# Patient Record
Sex: Male | Born: 1940
Health system: Southern US, Community
[De-identification: ages and names within clinical notes are randomized; demographics above are authoritative.]

## PROBLEM LIST (undated history)

## (undated) DIAGNOSIS — M1990 Unspecified osteoarthritis, unspecified site: Secondary | ICD-10-CM

## (undated) DIAGNOSIS — J302 Other seasonal allergic rhinitis: Secondary | ICD-10-CM

## (undated) DIAGNOSIS — I4510 Unspecified right bundle-branch block: Secondary | ICD-10-CM

## (undated) DIAGNOSIS — E039 Hypothyroidism, unspecified: Secondary | ICD-10-CM

## (undated) DIAGNOSIS — Z9289 Personal history of other medical treatment: Secondary | ICD-10-CM

## (undated) DIAGNOSIS — K219 Gastro-esophageal reflux disease without esophagitis: Secondary | ICD-10-CM

## (undated) DIAGNOSIS — N183 Chronic kidney disease, stage 3 unspecified: Secondary | ICD-10-CM

## (undated) DIAGNOSIS — R7302 Impaired glucose tolerance (oral): Secondary | ICD-10-CM

## (undated) DIAGNOSIS — D179 Benign lipomatous neoplasm, unspecified: Secondary | ICD-10-CM

## (undated) DIAGNOSIS — E669 Obesity, unspecified: Secondary | ICD-10-CM

## (undated) DIAGNOSIS — I809 Phlebitis and thrombophlebitis of unspecified site: Secondary | ICD-10-CM

## (undated) DIAGNOSIS — R1310 Dysphagia, unspecified: Secondary | ICD-10-CM

## (undated) DIAGNOSIS — J339 Nasal polyp, unspecified: Secondary | ICD-10-CM

## (undated) DIAGNOSIS — N529 Male erectile dysfunction, unspecified: Secondary | ICD-10-CM

## (undated) HISTORY — DX: Chronic kidney disease, stage 3 (moderate): N18.3

## (undated) HISTORY — DX: Impaired glucose tolerance (oral): R73.02

## (undated) HISTORY — DX: Male erectile dysfunction, unspecified: N52.9

## (undated) HISTORY — DX: Gastro-esophageal reflux disease without esophagitis: K21.9

## (undated) HISTORY — DX: Hypothyroidism, unspecified: E03.9

## (undated) HISTORY — PX: TONSILLECTOMY: SUR1361

## (undated) HISTORY — DX: Personal history of other medical treatment: Z92.89

## (undated) HISTORY — DX: Unspecified right bundle-branch block: I45.10

## (undated) HISTORY — DX: Other seasonal allergic rhinitis: J30.2

## (undated) HISTORY — DX: Obesity, unspecified: E66.9

## (undated) HISTORY — DX: Unspecified osteoarthritis, unspecified site: M19.90

## (undated) HISTORY — DX: Nasal polyp, unspecified: J33.9

## (undated) HISTORY — PX: LYMPH NODE BIOPSY: SHX201

## (undated) HISTORY — DX: Dysphagia, unspecified: R13.10

## (undated) HISTORY — DX: Benign lipomatous neoplasm, unspecified: D17.9

## (undated) HISTORY — DX: Chronic kidney disease, stage 3 unspecified: N18.30

---

## 2000-07-20 ENCOUNTER — Emergency Department (HOSPITAL_COMMUNITY): Admission: EM | Admit: 2000-07-20 | Discharge: 2000-07-20 | Payer: Self-pay | Admitting: Emergency Medicine

## 2013-02-24 DIAGNOSIS — Z125 Encounter for screening for malignant neoplasm of prostate: Secondary | ICD-10-CM | POA: Diagnosis not present

## 2013-02-24 DIAGNOSIS — K219 Gastro-esophageal reflux disease without esophagitis: Secondary | ICD-10-CM | POA: Diagnosis not present

## 2013-02-24 DIAGNOSIS — Z79899 Other long term (current) drug therapy: Secondary | ICD-10-CM | POA: Diagnosis not present

## 2013-02-24 DIAGNOSIS — N644 Mastodynia: Secondary | ICD-10-CM | POA: Diagnosis not present

## 2013-04-07 DIAGNOSIS — H60399 Other infective otitis externa, unspecified ear: Secondary | ICD-10-CM | POA: Diagnosis not present

## 2013-04-07 DIAGNOSIS — Z0000 Encounter for general adult medical examination without abnormal findings: Secondary | ICD-10-CM | POA: Diagnosis not present

## 2013-04-07 DIAGNOSIS — R0609 Other forms of dyspnea: Secondary | ICD-10-CM | POA: Diagnosis not present

## 2013-04-07 DIAGNOSIS — N529 Male erectile dysfunction, unspecified: Secondary | ICD-10-CM | POA: Diagnosis not present

## 2013-04-07 DIAGNOSIS — Z79899 Other long term (current) drug therapy: Secondary | ICD-10-CM | POA: Diagnosis not present

## 2013-04-07 DIAGNOSIS — J309 Allergic rhinitis, unspecified: Secondary | ICD-10-CM | POA: Diagnosis not present

## 2013-04-07 DIAGNOSIS — L820 Inflamed seborrheic keratosis: Secondary | ICD-10-CM | POA: Diagnosis not present

## 2013-04-07 DIAGNOSIS — R9431 Abnormal electrocardiogram [ECG] [EKG]: Secondary | ICD-10-CM | POA: Diagnosis not present

## 2013-04-07 DIAGNOSIS — K219 Gastro-esophageal reflux disease without esophagitis: Secondary | ICD-10-CM | POA: Diagnosis not present

## 2013-04-11 DIAGNOSIS — Z125 Encounter for screening for malignant neoplasm of prostate: Secondary | ICD-10-CM | POA: Diagnosis not present

## 2013-04-11 DIAGNOSIS — Z0000 Encounter for general adult medical examination without abnormal findings: Secondary | ICD-10-CM | POA: Diagnosis not present

## 2013-04-11 DIAGNOSIS — Z79899 Other long term (current) drug therapy: Secondary | ICD-10-CM | POA: Diagnosis not present

## 2013-04-11 DIAGNOSIS — N529 Male erectile dysfunction, unspecified: Secondary | ICD-10-CM | POA: Diagnosis not present

## 2013-05-09 DIAGNOSIS — E669 Obesity, unspecified: Secondary | ICD-10-CM | POA: Diagnosis not present

## 2013-05-09 DIAGNOSIS — R9431 Abnormal electrocardiogram [ECG] [EKG]: Secondary | ICD-10-CM | POA: Diagnosis not present

## 2013-05-09 DIAGNOSIS — R0602 Shortness of breath: Secondary | ICD-10-CM | POA: Diagnosis not present

## 2013-05-20 DIAGNOSIS — R9431 Abnormal electrocardiogram [ECG] [EKG]: Secondary | ICD-10-CM | POA: Diagnosis not present

## 2013-05-20 DIAGNOSIS — E669 Obesity, unspecified: Secondary | ICD-10-CM | POA: Diagnosis not present

## 2013-05-20 DIAGNOSIS — R0602 Shortness of breath: Secondary | ICD-10-CM | POA: Diagnosis not present

## 2013-07-12 DIAGNOSIS — N183 Chronic kidney disease, stage 3 unspecified: Secondary | ICD-10-CM | POA: Diagnosis not present

## 2013-07-12 DIAGNOSIS — R030 Elevated blood-pressure reading, without diagnosis of hypertension: Secondary | ICD-10-CM | POA: Diagnosis not present

## 2013-07-12 DIAGNOSIS — R9431 Abnormal electrocardiogram [ECG] [EKG]: Secondary | ICD-10-CM | POA: Diagnosis not present

## 2013-07-12 DIAGNOSIS — E669 Obesity, unspecified: Secondary | ICD-10-CM | POA: Diagnosis not present

## 2013-07-12 DIAGNOSIS — R0602 Shortness of breath: Secondary | ICD-10-CM | POA: Diagnosis not present

## 2013-07-12 DIAGNOSIS — I059 Rheumatic mitral valve disease, unspecified: Secondary | ICD-10-CM | POA: Diagnosis not present

## 2013-07-12 DIAGNOSIS — E559 Vitamin D deficiency, unspecified: Secondary | ICD-10-CM | POA: Diagnosis not present

## 2014-04-10 DIAGNOSIS — Z1331 Encounter for screening for depression: Secondary | ICD-10-CM | POA: Diagnosis not present

## 2014-04-10 DIAGNOSIS — Z136 Encounter for screening for cardiovascular disorders: Secondary | ICD-10-CM | POA: Diagnosis not present

## 2014-04-10 DIAGNOSIS — N183 Chronic kidney disease, stage 3 unspecified: Secondary | ICD-10-CM | POA: Diagnosis not present

## 2014-04-10 DIAGNOSIS — E559 Vitamin D deficiency, unspecified: Secondary | ICD-10-CM | POA: Diagnosis not present

## 2014-04-10 DIAGNOSIS — N529 Male erectile dysfunction, unspecified: Secondary | ICD-10-CM | POA: Diagnosis not present

## 2014-04-10 DIAGNOSIS — Z0000 Encounter for general adult medical examination without abnormal findings: Secondary | ICD-10-CM | POA: Diagnosis not present

## 2014-04-10 DIAGNOSIS — I059 Rheumatic mitral valve disease, unspecified: Secondary | ICD-10-CM | POA: Diagnosis not present

## 2014-04-10 DIAGNOSIS — R197 Diarrhea, unspecified: Secondary | ICD-10-CM | POA: Diagnosis not present

## 2014-04-18 DIAGNOSIS — E039 Hypothyroidism, unspecified: Secondary | ICD-10-CM | POA: Diagnosis not present

## 2014-04-18 DIAGNOSIS — Z125 Encounter for screening for malignant neoplasm of prostate: Secondary | ICD-10-CM | POA: Diagnosis not present

## 2014-04-18 DIAGNOSIS — N183 Chronic kidney disease, stage 3 unspecified: Secondary | ICD-10-CM | POA: Diagnosis not present

## 2014-04-26 ENCOUNTER — Encounter: Payer: Self-pay | Admitting: Interventional Cardiology

## 2014-05-15 DIAGNOSIS — E669 Obesity, unspecified: Secondary | ICD-10-CM | POA: Diagnosis not present

## 2014-05-15 DIAGNOSIS — Z6833 Body mass index (BMI) 33.0-33.9, adult: Secondary | ICD-10-CM | POA: Diagnosis not present

## 2014-05-15 DIAGNOSIS — R7309 Other abnormal glucose: Secondary | ICD-10-CM | POA: Diagnosis not present

## 2014-05-15 DIAGNOSIS — N183 Chronic kidney disease, stage 3 unspecified: Secondary | ICD-10-CM | POA: Diagnosis not present

## 2014-05-15 DIAGNOSIS — E039 Hypothyroidism, unspecified: Secondary | ICD-10-CM | POA: Diagnosis not present

## 2014-05-22 ENCOUNTER — Ambulatory Visit: Payer: Self-pay | Admitting: Interventional Cardiology

## 2014-09-05 ENCOUNTER — Encounter: Payer: Self-pay | Admitting: *Deleted

## 2015-05-29 DIAGNOSIS — Z1389 Encounter for screening for other disorder: Secondary | ICD-10-CM | POA: Diagnosis not present

## 2015-05-29 DIAGNOSIS — R195 Other fecal abnormalities: Secondary | ICD-10-CM | POA: Diagnosis not present

## 2015-05-29 DIAGNOSIS — I441 Atrioventricular block, second degree: Secondary | ICD-10-CM | POA: Diagnosis not present

## 2015-05-29 DIAGNOSIS — E559 Vitamin D deficiency, unspecified: Secondary | ICD-10-CM | POA: Diagnosis not present

## 2015-05-29 DIAGNOSIS — R030 Elevated blood-pressure reading, without diagnosis of hypertension: Secondary | ICD-10-CM | POA: Diagnosis not present

## 2015-05-29 DIAGNOSIS — I499 Cardiac arrhythmia, unspecified: Secondary | ICD-10-CM | POA: Diagnosis not present

## 2015-05-29 DIAGNOSIS — E669 Obesity, unspecified: Secondary | ICD-10-CM | POA: Diagnosis not present

## 2015-05-29 DIAGNOSIS — R739 Hyperglycemia, unspecified: Secondary | ICD-10-CM | POA: Diagnosis not present

## 2015-05-29 DIAGNOSIS — Z6832 Body mass index (BMI) 32.0-32.9, adult: Secondary | ICD-10-CM | POA: Diagnosis not present

## 2015-05-29 DIAGNOSIS — Z125 Encounter for screening for malignant neoplasm of prostate: Secondary | ICD-10-CM | POA: Diagnosis not present

## 2015-05-29 DIAGNOSIS — J209 Acute bronchitis, unspecified: Secondary | ICD-10-CM | POA: Diagnosis not present

## 2015-05-29 DIAGNOSIS — Z0001 Encounter for general adult medical examination with abnormal findings: Secondary | ICD-10-CM | POA: Diagnosis not present

## 2015-05-29 DIAGNOSIS — E780 Pure hypercholesterolemia: Secondary | ICD-10-CM | POA: Diagnosis not present

## 2015-06-05 ENCOUNTER — Telehealth: Payer: Self-pay | Admitting: Cardiology

## 2015-06-05 NOTE — Telephone Encounter (Signed)
Received records from [place] for appointment with [name], [place] on 06/14/15.  Records given to [employer] (medical records) for Smitty Cords' schedule on 06/14/15.

## 2015-06-12 ENCOUNTER — Telehealth: Payer: Self-pay | Admitting: Cardiology

## 2015-06-12 NOTE — Telephone Encounter (Signed)
Received records from [facility] for appointment on 06/14/15 with [name], [place].  Records given to [employer] (medical records) for Terrence Beard's schedule on 06/14/15. lp

## 2015-06-14 ENCOUNTER — Encounter: Payer: Self-pay | Admitting: Cardiology

## 2015-06-14 NOTE — Progress Notes (Signed)
Encounter opened in error.   Terrence Beard  

## 2015-07-04 DIAGNOSIS — R739 Hyperglycemia, unspecified: Secondary | ICD-10-CM | POA: Diagnosis not present

## 2015-07-04 DIAGNOSIS — R195 Other fecal abnormalities: Secondary | ICD-10-CM | POA: Diagnosis not present

## 2015-07-04 DIAGNOSIS — B80 Enterobiasis: Secondary | ICD-10-CM | POA: Diagnosis not present

## 2015-07-04 DIAGNOSIS — E559 Vitamin D deficiency, unspecified: Secondary | ICD-10-CM | POA: Diagnosis not present

## 2015-07-04 DIAGNOSIS — N529 Male erectile dysfunction, unspecified: Secondary | ICD-10-CM | POA: Diagnosis not present

## 2015-07-04 DIAGNOSIS — N183 Chronic kidney disease, stage 3 (moderate): Secondary | ICD-10-CM | POA: Diagnosis not present

## 2015-07-04 DIAGNOSIS — I441 Atrioventricular block, second degree: Secondary | ICD-10-CM | POA: Diagnosis not present

## 2015-07-04 DIAGNOSIS — R030 Elevated blood-pressure reading, without diagnosis of hypertension: Secondary | ICD-10-CM | POA: Diagnosis not present

## 2015-07-04 DIAGNOSIS — J209 Acute bronchitis, unspecified: Secondary | ICD-10-CM | POA: Diagnosis not present

## 2015-07-04 DIAGNOSIS — J309 Allergic rhinitis, unspecified: Secondary | ICD-10-CM | POA: Diagnosis not present

## 2015-07-04 DIAGNOSIS — I340 Nonrheumatic mitral (valve) insufficiency: Secondary | ICD-10-CM | POA: Diagnosis not present

## 2015-07-04 DIAGNOSIS — K219 Gastro-esophageal reflux disease without esophagitis: Secondary | ICD-10-CM | POA: Diagnosis not present

## 2015-07-19 ENCOUNTER — Other Ambulatory Visit: Payer: Self-pay | Admitting: Gastroenterology

## 2015-08-07 ENCOUNTER — Other Ambulatory Visit: Payer: Self-pay

## 2015-08-07 NOTE — Progress Notes (Signed)
Cardiology Office Note   Date:  08/08/2015   ID:  Jc Veron, DOB August 11, 1941, MRN [id number]  PCP:  Henrine Screws, MD  Cardiologist:  new  Electrophysiologist:  n/a  Chief Complaint  Patient presents with  . Abnormal ECG     History of Present Illness: Terrence Beard is a 74 y.o. male with a hx of RBBB, hypothyroidism, CKD, GERD.    Referred by his PCP for evaluation of abnormal EKG. He's had known right bundle branch block. ECG recently demonstrated second-degree block. Patient denies syncope or near-syncope. He denies significant dyspnea. He denies chest discomfort. He denies orthopnea, PND or significant edema. Patient tells me he had a Nuclear Stress Test 2 years ago. We do not have any records.   Studies/Reports Reviewed Today:  None    Past Medical History  Diagnosis Date  . RBBB   . Seasonal allergies   . Obesity   . Multiple lipomas   . Dysphagia   . Nasal polyposis   . Hypothyroidism   . Glucose intolerance (impaired glucose tolerance)   . CKD (chronic kidney disease) stage 3, GFR 30-59 ml/min   . Erectile dysfunction   . DJD (degenerative joint disease)   . GERD (gastroesophageal reflux disease)     Past Surgical History  Procedure Laterality Date  . Lymph node biopsy    . Tonsillectomy       Current Outpatient Prescriptions  Medication Sig Dispense Refill  . Cholecalciferol (VITAMIN D) 2000 UNITS tablet Take 2,000 Units by mouth daily.    Marland Kitchen levothyroxine (SYNTHROID, LEVOTHROID) 50 MCG tablet Take 50 mcg by mouth daily before breakfast.    . omeprazole (PRILOSEC) 20 MG capsule Take 20 mg by mouth daily.    . sildenafil (VIAGRA) 100 MG tablet Take 100 mg by mouth daily as needed for erectile dysfunction.     No current facility-administered medications for this visit.    Allergies:   Review of patient's allergies indicates no known allergies.    Social History:  Social History   Social History  . Marital Status: Divorced      Spouse Name: N/A  . Number of Children: N/A  . Years of Education: N/A   Occupational History  . Employee Benefit Consultant     [employer]   Social History Main Topics  . Smoking status: Never Smoker   . Smokeless tobacco: None  . Alcohol Use: 0.6 oz/week    1 Standard drinks or equivalent per week  . Drug Use: No  . Sexual Activity: Not Asked   Other Topics Concern  . None   Social History Narrative   Hobby:  Vineyard       Family History:  The patient's family history includes Diabetes in his maternal grandmother; GER disease in his father; Kidney disease in his mother. There is no history of Heart attack.    ROS:   Please see the history of present illness.   Review of Systems  Cardiovascular: Positive for irregular heartbeat.  Respiratory: Positive for snoring.   All other systems reviewed and are negative.     PHYSICAL EXAM: VS:  BP 140/90 mmHg  Pulse 58  Ht 5\' 11"  (1.803 m)  Wt 234 lb 12.8 oz (106.505 kg)  BMI 32.76 kg/m2    Wt Readings from Last 3 Encounters:  08/08/15 234 lb 12.8 oz (106.505 kg)     GEN: Well nourished, well developed, in no acute distress HEENT: normal Neck: no JVD,  no carotid bruits, no masses Cardiac:  Normal S1/S2, RRR; no murmur ,  no rubs or gallops, trace-1+ bilateral edema   Respiratory:  clear to auscultation bilaterally, no wheezing, rhonchi or rales. GI: soft, nontender, nondistended, + BS MS: no deformity or atrophy Skin: warm and dry  Neuro:  CNs II-XII intact, Strength and sensation are intact Psych: Normal affect   EKG:  EKG is ordered today.  It demonstrates:   Sinus brady, HR 58, RBBB, 1st degree AVB (460 ms), Wenckebach, LAD. ECG from PCP 7/16:  NSR, HR 64, 1st degree AVB, Wenckebach, RBBB    Recent Labs: No results found for requested labs within last 365 days.  Labs 7/16 (from PCP):  Hgb A1c 6.4, K 4.6, SCr 1.5, Hgb 14.2, Plt 263, ALT 22, TSH 2.89, TC 164, LDL 101, HDL 37  Lipid  Panel No results found for: CHOL, TRIG, HDL, CHOLHDL, VLDL, LDLCALC, LDLDIRECT    ASSESSMENT AND PLAN:  1. Trifascicular Block:  He has evidence of conduction system disease with a very long 1st degree AVB, 2nd degree Type 1 AVB, RBB and LAFB.  He appears to be asymptomatic. Reviewed with Dr. Thompson Grayer who also saw the patient.  Will get prior records from [place]. Recent K+ and TSH normal. He does not take any AV nodal blocking agents.  If no echo has been done in the last 2 years, will get a FU Echo as well. Arrange 48 Hr Holter to rule out high grade heart block.  Arrange FU with EP.     Medication Changes: Current medicines are reviewed at length with the patient today.  Concerns regarding medicines are as outlined above.  The following changes have been made:   Discontinued Medications   AZITHROMYCIN (ZITHROMAX) 250 MG TABLET    TAKE 2 TABLETS BY MOUTH TODAY, THEN TAKE 1 TABLET DAILY FOR 4 DAYS   NEOMYCIN-POLYMYXIN-HYDROCORTISONE (CORTISPORIN) 3.5-10000-1 OTIC SUSPENSION    INSTILL 4 DROPS INTO AFFECTED EAR(S) THREE TIMES PER DAY FOR 7 DAYS   POLYETHYLENE GLYCOL-ELECTROLYTES (NULYTELY/GOLYTELY) 420 G SOLUTION    See admin instructions.   Modified Medications   No medications on file   New Prescriptions   No medications on file   Labs/ tests ordered today include:   Orders Placed This Encounter  Procedures  . Holter monitor - 48 hour  . EKG 12-Lead     Disposition:    FU with Dr. Allegra Lai after Holter monitor is done.     Signed, Versie Starks, MHS 08/08/2015 1:14 PM    [facility] [address], [place], [place]  [address] Phone: [phone]; Fax: [phone]

## 2015-08-08 ENCOUNTER — Ambulatory Visit (INDEPENDENT_AMBULATORY_CARE_PROVIDER_SITE_OTHER): Payer: Medicare Other | Admitting: Physician Assistant

## 2015-08-08 ENCOUNTER — Encounter: Payer: Self-pay | Admitting: Physician Assistant

## 2015-08-08 VITALS — BP 140/90 | HR 58 | Ht 71.0 in | Wt 234.8 lb

## 2015-08-08 DIAGNOSIS — E039 Hypothyroidism, unspecified: Secondary | ICD-10-CM | POA: Insufficient documentation

## 2015-08-08 DIAGNOSIS — N189 Chronic kidney disease, unspecified: Secondary | ICD-10-CM | POA: Insufficient documentation

## 2015-08-08 DIAGNOSIS — I453 Trifascicular block: Secondary | ICD-10-CM

## 2015-08-08 DIAGNOSIS — R9431 Abnormal electrocardiogram [ECG] [EKG]: Secondary | ICD-10-CM

## 2015-08-08 DIAGNOSIS — I4510 Unspecified right bundle-branch block: Secondary | ICD-10-CM | POA: Insufficient documentation

## 2015-08-08 NOTE — Patient Instructions (Signed)
Medication Instructions:  Your physician recommends that you continue on your current medications as directed. Please refer to the Current Medication list given to you today.   Labwork: NONE  Testing/Procedures: Your physician has recommended that you wear a 48 HOUR holter monitor. Holter monitors are medical devices that record the heart's electrical activity. Doctors most often use these monitors to diagnose arrhythmias. Arrhythmias are problems with the speed or rhythm of the heartbeat. The monitor is a small, portable device. You can wear one while you do your normal daily activities. This is usually used to diagnose what is causing palpitations/syncope (passing out).   Follow-Up: 2-3 WEEKS WITH DR. Curt Bears DX TRIFASCICULAR BLOCK  Any Other Special Instructions Will Be Listed Below (If Applicable).

## 2015-08-13 ENCOUNTER — Other Ambulatory Visit: Payer: Self-pay | Admitting: Physician Assistant

## 2015-08-13 DIAGNOSIS — R9431 Abnormal electrocardiogram [ECG] [EKG]: Secondary | ICD-10-CM

## 2015-08-13 DIAGNOSIS — I440 Atrioventricular block, first degree: Secondary | ICD-10-CM

## 2015-08-13 DIAGNOSIS — I453 Trifascicular block: Secondary | ICD-10-CM

## 2015-08-13 DIAGNOSIS — I441 Atrioventricular block, second degree: Secondary | ICD-10-CM

## 2015-08-24 ENCOUNTER — Encounter: Payer: Self-pay | Admitting: Physician Assistant

## 2015-08-24 NOTE — Progress Notes (Signed)
Received records. PMH updated with prior stress test and echo. ETT-Myoview in 2014 with normal perfusion. Echo in 2014 with normal EF and mild to mod MR. Patient last saw Dr. Casandra Doffing in 2014 at [place]. Richardson Dopp, PA-C   08/24/2015 11:42 AM

## 2015-09-05 ENCOUNTER — Telehealth: Payer: Self-pay | Admitting: Physician Assistant

## 2015-09-05 NOTE — Telephone Encounter (Signed)
New Message  Pt was seen 10/2 w/ Nicki Reaper- was to have heart monitor and see Dr Curt Bears on 10/3. Pt was not able to do heart monitor, pt calling to see if he should postpone EP appt for after heart monitor, or come in anyway. Please call back and discuss.

## 2015-09-05 NOTE — Telephone Encounter (Signed)
S/w pt per Brynda Rim. PA recommendation is to cancel 11/3 appt w/Dr. Curt Bears, get 48 hour monitor placed this week then reschedule EP appt for 1 week later after monitor. Pt would like to have monitor placed Friday since he has to shower for work everyday.

## 2015-09-06 ENCOUNTER — Ambulatory Visit: Payer: Medicare Other | Admitting: Cardiology

## 2015-09-07 ENCOUNTER — Ambulatory Visit (INDEPENDENT_AMBULATORY_CARE_PROVIDER_SITE_OTHER): Payer: Medicare Other

## 2015-09-07 DIAGNOSIS — R9431 Abnormal electrocardiogram [ECG] [EKG]: Secondary | ICD-10-CM

## 2015-09-07 DIAGNOSIS — I441 Atrioventricular block, second degree: Secondary | ICD-10-CM | POA: Diagnosis not present

## 2015-09-07 DIAGNOSIS — I440 Atrioventricular block, first degree: Secondary | ICD-10-CM | POA: Diagnosis not present

## 2015-09-07 DIAGNOSIS — I453 Trifascicular block: Secondary | ICD-10-CM | POA: Diagnosis not present

## 2015-09-10 ENCOUNTER — Other Ambulatory Visit: Payer: Self-pay | Admitting: Gastroenterology

## 2015-09-11 ENCOUNTER — Encounter (HOSPITAL_COMMUNITY): Payer: Self-pay | Admitting: *Deleted

## 2015-09-18 ENCOUNTER — Encounter (HOSPITAL_COMMUNITY): Payer: Self-pay | Admitting: Anesthesiology

## 2015-09-18 ENCOUNTER — Ambulatory Visit (HOSPITAL_COMMUNITY): Admission: RE | Admit: 2015-09-18 | Payer: Medicare Other | Source: Ambulatory Visit | Admitting: Gastroenterology

## 2015-09-18 SURGERY — COLONOSCOPY WITH PROPOFOL
Anesthesia: Monitor Anesthesia Care

## 2015-09-19 ENCOUNTER — Ambulatory Visit (INDEPENDENT_AMBULATORY_CARE_PROVIDER_SITE_OTHER): Payer: Medicare Other | Admitting: Cardiology

## 2015-09-19 ENCOUNTER — Encounter: Payer: Self-pay | Admitting: Cardiology

## 2015-09-19 VITALS — BP 120/62 | HR 53 | Ht 71.0 in | Wt 237.6 lb

## 2015-09-19 DIAGNOSIS — I453 Trifascicular block: Secondary | ICD-10-CM

## 2015-09-19 DIAGNOSIS — Z01812 Encounter for preprocedural laboratory examination: Secondary | ICD-10-CM

## 2015-09-19 NOTE — Progress Notes (Signed)
Electrophysiology Office Note   Date:  09/19/2015   ID:  [name], Terrence Beard 08, 1942, MRN [id number]  PCP:  Henrine Screws, MD  Cardiologist:  Richardson Dopp Primary Electrophysiologist:  Damarie Schoolfield Meredith Leeds, MD    Chief Complaint  Patient presents with  . Follow-up    MONITOR     History of Present Illness: Terrence Beard is a 74 y.o. male who presents today for electrophysiology evaluation.   He has a history of long standing RBBB and was found to have trifascicular block on a recent ECG.  He has periods of mobitz I block as well as first degree AV block.  He is currently asymptomatic.  He has no dizziness but does endorse shortness of breath when walking up hills.  He is able to hunt and carry his guns and ammo through the woods without issues.  He had an event monitor showing [name] with a severely prolonged PR interval.   Today, he denies symptoms of palpitations, chest pain, shortness of breath, orthopnea, PND, lower extremity edema, claudication, dizziness, presyncope, syncope, bleeding, or neurologic sequela. The patient is tolerating medications without difficulties and is otherwise without complaint today.    Past Medical History  Diagnosis Date  . RBBB   . Seasonal allergies   . Obesity   . Multiple lipomas   . Dysphagia   . Nasal polyposis   . Hypothyroidism   . Glucose intolerance (impaired glucose tolerance)   . Erectile dysfunction   . DJD (degenerative joint disease)   . GERD (gastroesophageal reflux disease)   . History of nuclear stress test     a. ETT-Myoview 7/14:  EF 71%, normal perfusion, low risk  . History of echocardiogram     a. Echo 7/14:  EF 60-65%, mod asymmetric LVH, Mild LAE, mild to mod MR,   . CKD (chronic kidney disease) stage 3, GFR 30-59 ml/min     not treated   Past Surgical History  Procedure Laterality Date  . Lymph node biopsy    . Tonsillectomy       Current Outpatient Prescriptions  Medication Sig  Dispense Refill  . calcium carbonate (TUMS - DOSED IN MG ELEMENTAL CALCIUM) 500 MG chewable tablet Chew 2-3 tablets by mouth at bedtime as needed for indigestion or heartburn.    . Cholecalciferol (VITAMIN D) 2000 UNITS tablet Take 2,000 Units by mouth daily.    Marland Kitchen levothyroxine (SYNTHROID, LEVOTHROID) 50 MCG tablet Take 50 mcg by mouth daily before breakfast.    . omeprazole (PRILOSEC) 20 MG capsule Take 20 mg by mouth at bedtime as needed (heartburn).     . sildenafil (VIAGRA) 100 MG tablet Take 100 mg by mouth daily as needed for erectile dysfunction.     No current facility-administered medications for this visit.    Allergies:   Review of patient's allergies indicates no known allergies.   Social History:  The patient  reports that he has never smoked. He does not have any smokeless tobacco history on file. He reports that he drinks about 0.6 oz of alcohol per week. He reports that he does not use illicit drugs.   Family History:  The patient's family history includes Diabetes in his maternal grandmother; GER disease in his father; Kidney disease in his mother. There is no history of Heart attack.    ROS:  Please see the history of present illness.   All other systems are reviewed and negative.    PHYSICAL EXAM: VS:  BP 120/62  mmHg  Pulse 53  Ht 5\' 11"  (1.803 m)  Wt 237 lb 9.6 oz (107.775 kg)  BMI 33.15 kg/m2 , BMI Body mass index is 33.15 kg/(m^2). GEN: Well nourished, well developed, in no acute distress HEENT: normal Neck: no JVD, carotid bruits, or masses Cardiac: irregular rhythm; no murmurs, rubs, or gallops,no edema  Respiratory:  clear to auscultation bilaterally, normal work of breathing GI: soft, nontender, nondistended, + BS MS: no deformity or atrophy Skin: warm and dry Neuro:  Strength and sensation are intact Psych: euthymic mood, full affect  EKG:  EKG is ordered today. The ekg ordered today shows sinus rhythm, RBBB, LAFB, mobitz I block  Recent Labs: No  results found for requested labs within last 365 days.    Lipid Panel  No results found for: CHOL, TRIG, HDL, CHOLHDL, VLDL, LDLCALC, LDLDIRECT   Wt Readings from Last 3 Encounters:  09/19/15 237 lb 9.6 oz (107.775 kg)  08/08/15 234 lb 12.8 oz (106.505 kg)      Other studies Reviewed: Additional studies/ records that were reviewed today include: 48 hour monitor  Review of the above records today demonstrates:  Mean heart rate 67 bpm Maximum heart rate 109 bpm Minimum heart rate 48 bpm Ventricular beats 497 (0.32%) Supraventricular beats 542 (0.3%) Sinus rhythm with first degree AV block Intermittent Mobitz I block     TTE 09/07/15 EF 60-65%   ASSESSMENT AND PLAN:  1.  Trifascicular block: Unknown if supra vs infra-his block.  Patient is asymptomatic, but with significant conduction disease, unable to determine site of block.  Jalaina Salyers plan on EP study to determine site of block.  Should he have infra-his block, Saharsh Sterling plan to implant dual chamber pacemaker.  If his block is above the his, Johnita Palleschi plan to continue to monitor for worsening block.    Current medicines are reviewed at length with the patient today.   The patient does not have concerns regarding his medicines.  The following changes were made today:  none  Labs/ tests ordered today include:  Orders Placed This Encounter  Procedures  . Basic metabolic panel  . CBC w/Diff     Disposition:   FU with Kately Graffam post procedure  Signed, Kelse Ploch Meredith Leeds, MD  09/19/2015 8:45 PM     [address] [place] [address] [phone] (office) [phone] (fax)

## 2015-09-19 NOTE — Patient Instructions (Addendum)
Medication Instructions:  Your physician recommends that you continue on your current medications as directed. Please refer to the Current Medication list given to you today.  Labwork: Pre procedure labs on: 10/16/15  Testing/Procedures: Your physician has recommended that you have an EP Study. This test is used to assess serious arrhythmias (irregular heartbeats). During an Electro-physiology Study (EPS), a thin, flexible wire is passed through a vein in your groin (upper thigh) or neck up to the heart. The wire records the heart's electrical signals. Your doctor uses the wire to electrically stimulate your heart and trigger an arrhythmic. This allows the doctor to see whether an antiarrhythmia medicine can help manage the problem or if further procedures are necessary (i.e., ablation/ICD). Radiofrequency ablation, a procedure used to fix some types of arrthythmia, may be done during an EPS. This is done in the hospital and often requires an overnight stay.  Scheduled for 10/24/15. Kendrik Mcshan, RN will call you with details.  Follow-Up: To be determined once procedure is scheduled.   If you need a refill on your cardiac medications before your next appointment, please call your pharmacy.  Thank you for choosing [facility]!!   Trinidad Curet, RN [phone]

## 2015-09-25 ENCOUNTER — Encounter (HOSPITAL_COMMUNITY): Payer: Self-pay

## 2015-09-25 ENCOUNTER — Ambulatory Visit (HOSPITAL_COMMUNITY): Admit: 2015-09-25 | Payer: Self-pay | Admitting: Internal Medicine

## 2015-09-25 SURGERY — LOOP RECORDER INSERTION
Anesthesia: LOCAL

## 2015-10-02 ENCOUNTER — Encounter: Payer: Self-pay | Admitting: *Deleted

## 2015-10-02 ENCOUNTER — Telehealth: Payer: Self-pay | Admitting: *Deleted

## 2015-10-02 NOTE — Telephone Encounter (Signed)
Reviewed procedure instructions with patient. Patient made aware that PPM may be implanted secondary to EPS findings - and if implanted he will have to stay overnight in hospital. Letter of instructions reviewed with patient and mailed to home address. EPS & ? PPM implant scheduled for 12/21. Pre procedure labs 10/16/15. Wound check scheduled for 12/29.  (will follow procedure, if no PPM implanted - then I will cancel wound check). Patient verbalized understanding and agreeable to plan.

## 2015-10-09 NOTE — Addendum Note (Signed)
Addended by: Freada Bergeron on: 10/09/2015 05:09 PM   Modules accepted: Orders

## 2015-10-16 ENCOUNTER — Telehealth: Payer: Self-pay | Admitting: Cardiology

## 2015-10-16 ENCOUNTER — Other Ambulatory Visit: Payer: Medicare Other

## 2015-10-16 NOTE — Telephone Encounter (Signed)
New message      Pt is calling to postpone his 10-24-15 procedure and labs scheduled for today.  He will call to give you some good dates for it to be rescheduled after christmas.  You do not have to call unless this is not ok with the doctor.

## 2015-10-16 NOTE — Telephone Encounter (Signed)
Pt requesting to cancel procedure scheduled for 10/24/15. Pt advised I will forward to Sherri to follow up with pt tomorrow, I did not cancel procedure.

## 2015-10-17 NOTE — Telephone Encounter (Signed)
Returned patient's call.  He would like to reschedule but is currently driving and does not have his calendar on him. Patient will call me by end of week or beginning of next to reschedule.

## 2015-10-24 ENCOUNTER — Encounter (HOSPITAL_COMMUNITY): Admission: RE | Payer: Self-pay | Source: Ambulatory Visit

## 2015-10-24 ENCOUNTER — Ambulatory Visit (HOSPITAL_COMMUNITY): Admission: RE | Admit: 2015-10-24 | Payer: Medicare Other | Source: Ambulatory Visit

## 2015-10-24 SURGERY — ELECTROPHYSIOLOGY STUDY
Anesthesia: LOCAL

## 2015-11-01 ENCOUNTER — Ambulatory Visit: Payer: Medicare Other

## 2015-11-08 ENCOUNTER — Ambulatory Visit: Payer: Medicare Other

## 2015-11-29 ENCOUNTER — Encounter: Payer: Self-pay | Admitting: *Deleted

## 2015-11-29 NOTE — Telephone Encounter (Signed)
Have attempted to reach this patient for over one month now, leaving several messages, with no return phone call. Mailing letter to patient asking him to call the office to re-schedule EPS w/ possible PPM.

## 2015-12-19 ENCOUNTER — Other Ambulatory Visit: Payer: Self-pay | Admitting: Gastroenterology

## 2016-01-29 ENCOUNTER — Encounter (HOSPITAL_COMMUNITY): Payer: Self-pay | Admitting: *Deleted

## 2016-01-29 NOTE — Progress Notes (Signed)
Consulted Anesthesia, Dr. Landry Dyke about EKG -per Dr. Landry Dyke OK for procedure!

## 2016-02-05 ENCOUNTER — Ambulatory Visit (HOSPITAL_COMMUNITY): Payer: Medicare Other | Admitting: Anesthesiology

## 2016-02-05 ENCOUNTER — Encounter (HOSPITAL_COMMUNITY): Payer: Self-pay | Admitting: *Deleted

## 2016-02-05 ENCOUNTER — Ambulatory Visit (HOSPITAL_COMMUNITY)
Admission: RE | Admit: 2016-02-05 | Discharge: 2016-02-05 | Disposition: A | Discharge status: Home / Self Care | Payer: Medicare Other | Source: Ambulatory Visit | Attending: Gastroenterology | Admitting: Gastroenterology

## 2016-02-05 ENCOUNTER — Encounter (HOSPITAL_COMMUNITY)
Admission: RE | Disposition: A | Discharge status: Home / Self Care | Payer: Self-pay | Source: Ambulatory Visit | Attending: Gastroenterology

## 2016-02-05 DIAGNOSIS — Z1211 Encounter for screening for malignant neoplasm of colon: Secondary | ICD-10-CM | POA: Insufficient documentation

## 2016-02-05 DIAGNOSIS — K219 Gastro-esophageal reflux disease without esophagitis: Secondary | ICD-10-CM | POA: Insufficient documentation

## 2016-02-05 DIAGNOSIS — E039 Hypothyroidism, unspecified: Secondary | ICD-10-CM | POA: Diagnosis not present

## 2016-02-05 DIAGNOSIS — I453 Trifascicular block: Secondary | ICD-10-CM | POA: Insufficient documentation

## 2016-02-05 DIAGNOSIS — M1990 Unspecified osteoarthritis, unspecified site: Secondary | ICD-10-CM | POA: Diagnosis not present

## 2016-02-05 DIAGNOSIS — D120 Benign neoplasm of cecum: Secondary | ICD-10-CM | POA: Diagnosis not present

## 2016-02-05 DIAGNOSIS — M160 Bilateral primary osteoarthritis of hip: Secondary | ICD-10-CM | POA: Diagnosis not present

## 2016-02-05 DIAGNOSIS — M170 Bilateral primary osteoarthritis of knee: Secondary | ICD-10-CM | POA: Diagnosis not present

## 2016-02-05 DIAGNOSIS — I499 Cardiac arrhythmia, unspecified: Secondary | ICD-10-CM | POA: Insufficient documentation

## 2016-02-05 DIAGNOSIS — K635 Polyp of colon: Secondary | ICD-10-CM | POA: Diagnosis not present

## 2016-02-05 DIAGNOSIS — J302 Other seasonal allergic rhinitis: Secondary | ICD-10-CM | POA: Insufficient documentation

## 2016-02-05 DIAGNOSIS — N183 Chronic kidney disease, stage 3 (moderate): Secondary | ICD-10-CM | POA: Diagnosis not present

## 2016-02-05 HISTORY — PX: COLONOSCOPY WITH PROPOFOL: SHX5780

## 2016-02-05 SURGERY — COLONOSCOPY WITH PROPOFOL
Anesthesia: Monitor Anesthesia Care

## 2016-02-05 MED ORDER — LIDOCAINE HCL (CARDIAC) 20 MG/ML IV SOLN
INTRAVENOUS | Status: DC | PRN
  Administered 2016-02-05: 100 mg via INTRAVENOUS

## 2016-02-05 MED ORDER — SODIUM CHLORIDE 0.9 % IV SOLN: INTRAVENOUS | Status: DC

## 2016-02-05 MED ORDER — PROPOFOL 500 MG/50ML IV EMUL
INTRAVENOUS | Status: DC | PRN
  Administered 2016-02-05: 130 ug/kg/min via INTRAVENOUS

## 2016-02-05 MED ORDER — LACTATED RINGERS IV SOLN
INTRAVENOUS | Status: DC
  Administered 2016-02-05: 1000 mL via INTRAVENOUS

## 2016-02-05 MED ORDER — LIDOCAINE HCL (CARDIAC) 20 MG/ML IV SOLN
INTRAVENOUS | Status: AC
  Filled 2016-02-05: qty 5

## 2016-02-05 MED ORDER — PROPOFOL 10 MG/ML IV BOLUS
INTRAVENOUS | Status: DC | PRN
  Administered 2016-02-05 (×2): 20 mg via INTRAVENOUS

## 2016-02-05 MED ORDER — PROPOFOL 10 MG/ML IV BOLUS
INTRAVENOUS | Status: AC
  Filled 2016-02-05: qty 40

## 2016-02-05 SURGICAL SUPPLY — 21 items

## 2016-02-05 NOTE — Anesthesia Preprocedure Evaluation (Addendum)
Anesthesia Evaluation  Patient identified by MRN, date of birth, ID band Patient awake    Reviewed: Allergy & Precautions, NPO status , Patient's Chart, lab work & pertinent test results  Airway Mallampati: III  TM Distance: >3 FB Neck ROM: Full    Dental   Pulmonary neg pulmonary ROS,    breath sounds clear to auscultation       Cardiovascular + dysrhythmias (RBB. 2nd Degree Mobitz Type I.)  Rhythm:Regular Rate:Normal  2014: Low risk myocardial perfusion scan. Normal EF.   Neuro/Psych negative neurological ROS     GI/Hepatic Neg liver ROS, GERD  ,  Endo/Other  Hypothyroidism   Renal/GU Renal disease     Musculoskeletal  (+) Arthritis ,   Abdominal   Peds  Hematology negative hematology ROS (+)   Anesthesia Other Findings   Reproductive/Obstetrics                            Anesthesia Physical Anesthesia Plan  ASA: III  Anesthesia Plan: MAC   Post-op Pain Management:    Induction: Intravenous  Airway Management Planned: Natural Airway and Simple Face Mask  Additional Equipment:   Intra-op Plan:   Post-operative Plan:   Informed Consent: I have reviewed the patients History and Physical, chart, labs and discussed the procedure including the risks, benefits and alternatives for the proposed anesthesia with the patient or authorized representative who has indicated his/her understanding and acceptance.     Plan Discussed with: CRNA  Anesthesia Plan Comments:         Anesthesia Quick Evaluation

## 2016-02-05 NOTE — H&P (Signed)
  Procedure: Screening colonoscopy. 02/18/2008 normal baseline screening colonoscopy was performed  History: The patient is a 75 year old male born [birth year]. He is scheduled to undergo a repeat screening colonoscopy today.  Past medical history: Trifascicular heart block. Nasal polyposis. Seasonal allergies. Osteoarthritis of the hips and knees. Hypothyroidism. Tonsillectomy.  Exam: The patient is alert and lying comfortably on the endoscopy stretcher. Abdomen is soft and nontender to palpation. Lungs are clear to auscultation. Cardiac exam reveals a regular rhythm.  Plan: Proceed with screening colonoscopy

## 2016-02-05 NOTE — Anesthesia Postprocedure Evaluation (Signed)
Anesthesia Post Note  Patient: Terrence Beard  Procedure(s) Performed: Procedure(s) (LRB): COLONOSCOPY WITH PROPOFOL (N/A)  Patient location during evaluation: PACU Anesthesia Type: MAC Level of consciousness: awake and alert Pain management: pain level controlled Vital Signs Assessment: post-procedure vital signs reviewed and stable Respiratory status: spontaneous breathing, nonlabored ventilation, respiratory function stable and patient connected to nasal cannula oxygen Cardiovascular status: stable and blood pressure returned to baseline Anesthetic complications: no    Last Vitals:  Filed Vitals:   02/05/16 1230 02/05/16 1240  BP: 130/83 134/89  Pulse: 61 68  Temp:    Resp: 16 14    Last Pain: There were no vitals filed for this visit.               Tiajuana Amass

## 2016-02-05 NOTE — Transfer of Care (Signed)
Immediate Anesthesia Transfer of Care Note  Patient: Terrence Beard  Procedure(s) Performed: Procedure(s): COLONOSCOPY WITH PROPOFOL (N/A)  Patient Location: PACU and Endoscopy Unit  Anesthesia Type:MAC  Level of Consciousness: awake, alert , oriented and patient cooperative  Airway & Oxygen Therapy: Patient Spontanous Breathing and Patient connected to face mask oxygen  Post-op Assessment: Report given to RN, Post -op Vital signs reviewed and stable and Patient moving all extremities  Post vital signs: Reviewed and stable  Last Vitals:  Filed Vitals:   02/05/16 0941  BP: 133/68  Pulse: 63  Temp: 36.7 C  Resp: 17    Complications: No apparent anesthesia complications

## 2016-02-05 NOTE — Discharge Instructions (Signed)

## 2016-02-05 NOTE — Op Note (Signed)
[facility] Patient Name: Terrence Beard Procedure Date: 02/05/2016 MRN: [id number] Attending MD: Garlan Fair , MD Date of Birth: 12/29/1940 CSN:  Age: 75 Admit Type: Outpatient Procedure:                Colonoscopy Indications:              Screening for colorectal malignant neoplasm Providers:                Garlan Fair, MD, Hilma Favors, RN, Alfonso Patten, Technician, Carleene Cooper, CRNA Referring MD:              Medicines:                Propofol per Anesthesia Complications:            No immediate complications. Estimated Blood Loss:     Estimated blood loss: none. Procedure:                Pre-Anesthesia Assessment:                           - Prior to the procedure, a History and Physical                            was performed, and patient medications and                            allergies were reviewed. The patient's tolerance of                            previous anesthesia was also reviewed. The risks                            and benefits of the procedure and the sedation                            options and risks were discussed with the patient.                            All questions were answered, and informed consent                            was obtained. Prior Anticoagulants: The patient has                            taken no previous anticoagulant or antiplatelet                            agents. ASA Grade Assessment: II - A patient with                            mild systemic disease. After reviewing the risks  and benefits, the patient was deemed in                            satisfactory condition to undergo the procedure.                           After obtaining informed consent, the colonoscope                            was passed under direct vision. Throughout the                            procedure, the patient's blood pressure, pulse, and          oxygen saturations were monitored continuously. The                            [id number]) scope was introduced through                            the anus and advanced to the the cecum, identified                            by appendiceal orifice and ileocecal valve. The                            colonoscopy was performed without difficulty. The                            patient tolerated the procedure well. The quality                            of the bowel preparation was good. The appendiceal                            orifice and the rectum were photographed. Scope In: 11:47:29 AM Scope Out: 12:07:01 PM Scope Withdrawal Time: 0 hours 9 minutes 44 seconds  Total Procedure Duration: 0 hours 19 minutes 32 seconds  Findings:      The perianal and digital rectal examinations were normal.      A 3 mm polyp was found in the cecum. The polyp was sessile. The polyp       was removed with a cold biopsy forceps. Resection and retrieval were       complete.      The exam was otherwise without abnormality. Impression:               - One 3 mm polyp in the cecum, removed with a cold                            biopsy forceps. Resected and retrieved.                           - The examination was otherwise normal. Moderate Sedation:      N/A- Per Anesthesia Care Recommendation:           -  Patient has a contact number available for                            emergencies. The signs and symptoms of potential                            delayed complications were discussed with the                            patient. Return to normal activities tomorrow.                            Written discharge instructions were provided to the                            patient.                           - Repeat colonoscopy date to be determined after                            pending pathology results are reviewed for                            surveillance.                           -  Resume previous diet.                           - Continue present medications. Procedure Code(s):        --- Professional ---                           [phone], Colonoscopy, flexible; with biopsy, single                            or multiple Diagnosis Code(s):        --- Professional ---                           Z12.11, Encounter for screening for malignant                            neoplasm of colon                           D12.0, Benign neoplasm of cecum CPT copyright 2016 [employer]. All rights reserved. The codes documented in this report are preliminary and upon coder review may  be revised to meet current compliance requirements. Earle Gell, MD Garlan Fair, MD 02/05/2016 12:12:20 PM This report has been signed electronically. Number of Addenda: 0

## 2016-02-06 ENCOUNTER — Encounter (HOSPITAL_COMMUNITY): Payer: Self-pay | Admitting: Gastroenterology

## 2016-02-07 ENCOUNTER — Encounter (HOSPITAL_COMMUNITY): Payer: Self-pay | Admitting: Gastroenterology

## 2016-06-02 DIAGNOSIS — Z0001 Encounter for general adult medical examination with abnormal findings: Secondary | ICD-10-CM | POA: Diagnosis not present

## 2016-06-02 DIAGNOSIS — Z1389 Encounter for screening for other disorder: Secondary | ICD-10-CM | POA: Diagnosis not present

## 2016-06-02 DIAGNOSIS — N183 Chronic kidney disease, stage 3 (moderate): Secondary | ICD-10-CM | POA: Diagnosis not present

## 2016-06-02 DIAGNOSIS — I499 Cardiac arrhythmia, unspecified: Secondary | ICD-10-CM | POA: Diagnosis not present

## 2016-06-02 DIAGNOSIS — R030 Elevated blood-pressure reading, without diagnosis of hypertension: Secondary | ICD-10-CM | POA: Diagnosis not present

## 2016-06-02 DIAGNOSIS — I441 Atrioventricular block, second degree: Secondary | ICD-10-CM | POA: Diagnosis not present

## 2016-06-02 DIAGNOSIS — E559 Vitamin D deficiency, unspecified: Secondary | ICD-10-CM | POA: Diagnosis not present

## 2016-06-02 DIAGNOSIS — E039 Hypothyroidism, unspecified: Secondary | ICD-10-CM | POA: Diagnosis not present

## 2016-06-02 DIAGNOSIS — B80 Enterobiasis: Secondary | ICD-10-CM | POA: Diagnosis not present

## 2016-06-02 DIAGNOSIS — R739 Hyperglycemia, unspecified: Secondary | ICD-10-CM | POA: Diagnosis not present

## 2016-06-02 DIAGNOSIS — Z125 Encounter for screening for malignant neoplasm of prostate: Secondary | ICD-10-CM | POA: Diagnosis not present

## 2016-06-02 DIAGNOSIS — N529 Male erectile dysfunction, unspecified: Secondary | ICD-10-CM | POA: Diagnosis not present

## 2016-06-05 DIAGNOSIS — R739 Hyperglycemia, unspecified: Secondary | ICD-10-CM | POA: Diagnosis not present

## 2016-06-05 DIAGNOSIS — N183 Chronic kidney disease, stage 3 (moderate): Secondary | ICD-10-CM | POA: Diagnosis not present

## 2016-06-05 DIAGNOSIS — R030 Elevated blood-pressure reading, without diagnosis of hypertension: Secondary | ICD-10-CM | POA: Diagnosis not present

## 2016-06-08 ENCOUNTER — Ambulatory Visit (HOSPITAL_COMMUNITY)
Admission: EM | Admit: 2016-06-08 | Discharge: 2016-06-08 | Disposition: A | Discharge status: Nursing Home (Includes ICF) | Payer: Medicare Other | Attending: Family Medicine | Admitting: Family Medicine

## 2016-06-08 ENCOUNTER — Emergency Department (HOSPITAL_COMMUNITY)
Admission: EM | Admit: 2016-06-08 | Discharge: 2016-06-08 | Disposition: A | Discharge status: Home / Self Care | Payer: Medicare Other | Attending: Emergency Medicine | Admitting: Emergency Medicine

## 2016-06-08 ENCOUNTER — Emergency Department (HOSPITAL_BASED_OUTPATIENT_CLINIC_OR_DEPARTMENT_OTHER): Payer: Medicare Other

## 2016-06-08 ENCOUNTER — Encounter (HOSPITAL_COMMUNITY): Payer: Self-pay | Admitting: *Deleted

## 2016-06-08 DIAGNOSIS — I82491 Acute embolism and thrombosis of other specified deep vein of right lower extremity: Secondary | ICD-10-CM | POA: Diagnosis not present

## 2016-06-08 DIAGNOSIS — E039 Hypothyroidism, unspecified: Secondary | ICD-10-CM | POA: Diagnosis not present

## 2016-06-08 DIAGNOSIS — I82401 Acute embolism and thrombosis of unspecified deep veins of right lower extremity: Secondary | ICD-10-CM | POA: Diagnosis not present

## 2016-06-08 DIAGNOSIS — I824Z1 Acute embolism and thrombosis of unspecified deep veins of right distal lower extremity: Secondary | ICD-10-CM | POA: Diagnosis not present

## 2016-06-08 DIAGNOSIS — M79604 Pain in right leg: Secondary | ICD-10-CM | POA: Diagnosis present

## 2016-06-08 DIAGNOSIS — M79609 Pain in unspecified limb: Secondary | ICD-10-CM

## 2016-06-08 DIAGNOSIS — N183 Chronic kidney disease, stage 3 (moderate): Secondary | ICD-10-CM | POA: Insufficient documentation

## 2016-06-08 DIAGNOSIS — R609 Edema, unspecified: Secondary | ICD-10-CM | POA: Diagnosis not present

## 2016-06-08 DIAGNOSIS — Z7901 Long term (current) use of anticoagulants: Secondary | ICD-10-CM | POA: Diagnosis not present

## 2016-06-08 LAB — CBC
HEMATOCRIT: 42.9 % (ref 39.0–52.0)
HEMOGLOBIN: 14 g/dL (ref 13.0–17.0)
MCH: 30.3 pg (ref 26.0–34.0)
MCHC: 32.6 g/dL (ref 30.0–36.0)
MCV: 92.9 fL (ref 78.0–100.0)
Platelets: 264 10*3/uL (ref 150–400)
RBC: 4.62 MIL/uL (ref 4.22–5.81)
RDW: 13.8 % (ref 11.5–15.5)
WBC: 12.2 10*3/uL — AB (ref 4.0–10.5)

## 2016-06-08 LAB — BASIC METABOLIC PANEL
ANION GAP: 8 (ref 5–15)
BUN: 15 mg/dL (ref 6–20)
CHLORIDE: 99 mmol/L — AB (ref 101–111)
CO2: 20 mmol/L — AB (ref 22–32)
Calcium: 8.6 mg/dL — ABNORMAL LOW (ref 8.9–10.3)
Creatinine, Ser: 1.25 mg/dL — ABNORMAL HIGH (ref 0.61–1.24)
GFR calc non Af Amer: 55 mL/min — ABNORMAL LOW (ref >60)
GLUCOSE: 104 mg/dL — AB (ref 65–99)
Potassium: 4.1 mmol/L (ref 3.5–5.1)
Sodium: 127 mmol/L — ABNORMAL LOW (ref 135–145)

## 2016-06-08 MED ORDER — RIVAROXABAN 15 MG PO TABS
15.0000 mg | ORAL_TABLET | Freq: Once | ORAL | Status: AC
  Administered 2016-06-08: 15 mg via ORAL
  Filled 2016-06-08: qty 1

## 2016-06-08 MED ORDER — ACETAMINOPHEN 325 MG PO TABS
650.0000 mg | ORAL_TABLET | Freq: Once | ORAL | Status: AC
  Administered 2016-06-08: 650 mg via ORAL

## 2016-06-08 MED ORDER — RIVAROXABAN (XARELTO) VTE STARTER PACK (15 & 20 MG): ORAL_TABLET | ORAL | 0 refills | Status: DC

## 2016-06-08 MED ORDER — RIVAROXABAN (XARELTO) EDUCATION KIT FOR DVT/PE PATIENTS
PACK | Freq: Once | Status: AC
  Administered 2016-06-08: 21:00:00
  Filled 2016-06-08: qty 1

## 2016-06-08 MED ORDER — ACETAMINOPHEN 325 MG PO TABS
ORAL_TABLET | ORAL | Status: AC
  Filled 2016-06-08: qty 2

## 2016-06-08 NOTE — ED Triage Notes (Signed)
Awakened by sudden onset "charlie horse" in right calf 4 nights ago. Has continued with pain in right calf with progressive swelling down into feet.  Has been applying ice and taking Advil.  Started feeling feverish yesterday.  Difficulty bearing weight due to pain.  Reports having had physical at PCP 6 days ago - was told Vit D levels very low; started taking supplemental Vit D this week.

## 2016-06-08 NOTE — ED Provider Notes (Signed)
  Face-to-face evaluation   History: He complains of cramping and pain in his right lower leg for several days. No chest pain or shortness of breath.  Physical exam: Alert, calm, cooperative. Currently, tender and swollen. No associated erythema, or skin lesions.  Medical screening examination/treatment/procedure(s) were conducted as a shared visit with non-physician practitioner(s) and myself.  I personally evaluated the patient during the encounter   Daleen Bo, MD 06/12/16 1557

## 2016-06-08 NOTE — Progress Notes (Signed)
VASCULAR LAB PRELIMINARY  PRELIMINARY  PRELIMINARY  PRELIMINARY  Right lower extremity venous duplex completed.    Preliminary report:  There is acute, occlusive DVT noted in the right peroneal and gastrocnemius veins.  Enlarged right inguinal lymph node noted.  Called report to Dr. Madalyn Rob, [facility], RVT 06/08/2016, 6:17 PM

## 2016-06-08 NOTE — ED Notes (Signed)
Called pharmacy and made aware of pt waiting on Xarelto.

## 2016-06-08 NOTE — ED Provider Notes (Signed)
[name]    CSN: [id number] Arrival date & time: 06/08/16  1502  First Provider Contact:  First MD Initiated Contact with Patient 06/08/16 1548     History   Chief Complaint Chief Complaint  Patient presents with  . Leg Pain  . Fever    HPI Terrence Beard is a 75 y.o. male with a history of RBBB presenting for RLE swelling and pain.   He woke up 8/2 with pain in the right calf radiating up the back of the leg that was moderate, aching, and causing a limp. He still went to work the next 3 days, but pain has persisted and swelling has worsened despite elevating the leg. No other medications tried. Only takes synthroid and reports good compliance. He travels a few hundred miles per day a few days per week for work. No personal history of clots or malignancy. Father had recurrent phlebitis. He denies any redness but has had fevers for the past 2 - 3 days as well. Does not feel ill, no cough, dysuria, N/V/D/emesis.   HPI  Past Medical History:  Diagnosis Date  . CKD (chronic kidney disease) stage 3, GFR 30-59 ml/min    not treated  . DJD (degenerative joint disease)   . Dysphagia   . Erectile dysfunction   . GERD (gastroesophageal reflux disease)   . Glucose intolerance (impaired glucose tolerance)   . History of echocardiogram    a. Echo 7/14:  EF 60-65%, mod asymmetric LVH, Mild LAE, mild to mod MR,   . History of nuclear stress test    a. ETT-Myoview 7/14:  EF 71%, normal perfusion, low risk  . Hypothyroidism   . Multiple lipomas   . Nasal polyposis   . Obesity   . RBBB   . Seasonal allergies     Patient Active Problem List   Diagnosis Date Noted  . RBBB   . Hypothyroidism   . CKD (chronic kidney disease) stage 3, GFR 30-59 ml/min     Past Surgical History:  Procedure Laterality Date  . COLONOSCOPY WITH PROPOFOL N/A 02/05/2016   Procedure: COLONOSCOPY WITH PROPOFOL;  Surgeon: Garlan Fair, MD;  Location: [facility] ENDOSCOPY;  Service: Endoscopy;   Laterality: N/A;  . LYMPH NODE BIOPSY    . TONSILLECTOMY     Home Medications    Prior to Admission medications   Medication Sig Start Date End Date Taking? Authorizing Provider  levothyroxine (SYNTHROID, LEVOTHROID) 50 MCG tablet Take 50 mcg by mouth daily before breakfast.   Yes Historical Provider, MD  MULTIPLE VITAMINS PO Take by mouth.   Yes Historical Provider, MD  calcium carbonate (TUMS - DOSED IN MG ELEMENTAL CALCIUM) 500 MG chewable tablet Chew 2-3 tablets by mouth at bedtime as needed for indigestion or heartburn.    Historical Provider, MD  Cholecalciferol (VITAMIN D) 2000 UNITS tablet Take 2,000 Units by mouth 2 (two) times a week.     Historical Provider, MD  omeprazole (PRILOSEC) 20 MG capsule Take 20 mg by mouth at bedtime as needed (heartburn).     Historical Provider, MD  sildenafil (VIAGRA) 100 MG tablet Take 100 mg by mouth daily as needed for erectile dysfunction.    Historical Provider, MD   Family History Family History  Problem Relation Age of Onset  . GER disease Father     died in his 71s  . Kidney disease Mother   . Diabetes Maternal Grandmother   . Heart attack Neg Hx  Social History Social History  Substance Use Topics  . Smoking status: Never Smoker  . Smokeless tobacco: Not on file  . Alcohol use Yes     Comment: twice/mo     Allergies   Review of patient's allergies indicates no known allergies.   Review of Systems Review of Systems As above  Physical Exam Triage Vital Signs ED Triage Vitals [06/08/16 1527]  Enc Vitals Group     BP 154/82     Pulse Rate 62     Resp      Temp 101.9 F (38.8 C)     Temp Source Oral     SpO2 96 %     Weight      Height      Head Circumference      Peak Flow      Pain Score 7     Pain Loc      Pain Edu?      Excl. in [place]?    No data found.   Updated Vital Signs BP 154/82 (BP Location: Left Arm)   Pulse 62   Temp 101.9 F (38.8 C) (Oral)   SpO2 96%   Physical Exam  Constitutional:  He is oriented to person, place, and time. No distress.  Neck: Normal range of motion. Neck supple. No thyromegaly present.  Cardiovascular: Normal rate and regular rhythm.  Exam reveals no gallop and no friction rub.   Murmur (II/VI SEM loudest over RUSB) heard. 1+ bilateral DP pulses, cap refill < 2 sec  Pulmonary/Chest: Effort normal and breath sounds normal. No stridor. No respiratory distress. He has no wheezes. He has no rales.  Abdominal: Soft. There is no tenderness.  Musculoskeletal:  RLE with 2+ tender pitting edema to the knee without erythema. Homan's weakly positive without palpable cords or varicosities. Right foot with onychomycosis and white fungal infection in interdigital spaces without skin breakdown or erythema.   Neurological: He is alert and oriented to person, place, and time.   UC Treatments / Results  Labs (all labs ordered are listed, but only abnormal results are displayed) Labs Reviewed - No data to display  EKG  EKG Interpretation None       Radiology No results found.  Procedures Procedures (including critical care time)  Medications Ordered in UC Medications  acetaminophen (TYLENOL) tablet 650 mg (650 mg Oral Given 06/08/16 1545)   Initial Impression / Assessment and Plan / UC Course  I have reviewed the triage vital signs and the nursing notes.  Pertinent labs & imaging results that were available during my care of the patient were reviewed by me and considered in my medical decision making (see chart for details).  Clinical Course   Final Clinical Impressions(s) / UC Diagnoses   Final diagnoses:  Acute deep vein thrombosis (DVT) of other specified vein of right lower extremity (HCC)   Generally healthy 75 y.o. male with + FH DVT presenting with unilateral acutely swollen, painful extremity consistent with DVT. Also febrile, which could be due to DVT. No clinical evidence of cellulitis. Will send to ED for evaluation by private  vehicle.  New Prescriptions New Prescriptions   No medications on file     Patrecia Pour, MD 06/08/16 [phone]

## 2016-06-08 NOTE — Discharge Instructions (Signed)
Read the information below.   You have a DVT in your right leg. You are being started on an anticoagulant (blood thinner) Xarelto. Take as prescribed.  When taking a blood thinner you are at increase risk of bleeding. It is important that if you have any falls or hit your head you are evaluated. If you notice easy bruising, bleeding gums, nose bleeds, blood in your urine, or blood in your stool seek medical attention immediately.  Use the prescribed medication as directed.  Please discuss all new medications with your pharmacist.   Please schedule a follow up appointment with your primary care provider for the next week for re-evaluation.  You may return to the Emergency Department at any time for worsening condition or any new symptoms that concern you. Return to the ED if you develop chest pain, shortness of breath, dizziness, lightheadedness, or loss of consciousness.

## 2016-06-08 NOTE — ED Notes (Signed)
Report called to Luellen Pucker, ED First Nurse.

## 2016-06-08 NOTE — ED Notes (Signed)
Pt returned from venous doppler.  Pt denies any pain at this time

## 2016-06-08 NOTE — ED Triage Notes (Addendum)
Pt sent here from ucc, reports right calf cramping, swelling and pain x 4 days. Also reports generalized fatigue. Moderate swelling noted to right lower leg and foot. Was febrile at ucc, given tylenol.

## 2016-06-09 NOTE — ED Provider Notes (Signed)
[facility] DEPT Provider Note   CSN: [id number] Arrival date & time: 06/08/16  1615  First Provider Contact:  None       History   Chief Complaint Chief Complaint  Patient presents with  . Leg Pain    HPI Terrence Beard is a 75 y.o. male.  Terrence Beard is a 75 y.o. male with h/o CKD stage III, RBBB, GERD, and hypothyroidism presents to ED with complaint of right lower extremity pain and swelling. Patient reports he started having pain in his right calf on Wednesday. He initially thought it was a cramp and tried to "walk it off" over the next few days. He tried laying in bed over the weekend; however, the pain and swelling progressively worsened. He has associated fever, warmth, and redness. He was seen at the UC today and noted to have a fever of 101.9. Concern for possible DVT and sent to ED. 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 He denies any h/o blood clots; however, his father had blood clots and phlebitis. He denies h/o cancer or hemoptysis. No recent surgeries or immobilizations. He denies chest pain, shortness of breath, fatigue, dizziness, lightheadedness, or LOC. Last colonoscopy was normal. No h/o bleeding disorders. No h/o GI bleeds. No recent falls or trauma.       Past Medical History:  Diagnosis Date  . CKD (chronic kidney disease) stage 3, GFR 30-59 ml/min    not treated  . DJD (degenerative joint disease)   . Dysphagia   . Erectile dysfunction   . GERD (gastroesophageal reflux disease)   . Glucose intolerance (impaired glucose tolerance)   . History of echocardiogram    a. Echo 7/14:  EF 60-65%, mod asymmetric LVH, Mild LAE, mild to mod MR,   . History of nuclear stress test    a. ETT-Myoview 7/14:  EF 71%, normal perfusion, low risk  . Hypothyroidism   . Multiple lipomas   . Nasal polyposis   . Obesity   . RBBB   . Seasonal allergies     Patient Active Problem List   Diagnosis Date Noted  . RBBB   .  Hypothyroidism   . CKD (chronic kidney disease) stage 3, GFR 30-59 ml/min     Past Surgical History:  Procedure Laterality Date  . COLONOSCOPY WITH PROPOFOL N/A 02/05/2016   Procedure: COLONOSCOPY WITH PROPOFOL;  Surgeon: Garlan Fair, MD;  Location: [facility];  Service: Endoscopy;  Laterality: N/A;  . LYMPH NODE BIOPSY    . TONSILLECTOMY         Home Medications    Prior to Admission medications   Medication Sig Start Date End Date Taking? Authorizing Provider  calcium carbonate (TUMS - DOSED IN MG ELEMENTAL CALCIUM) 500 MG chewable tablet Chew 2-3 tablets by mouth at bedtime as needed for indigestion or heartburn.    Historical Provider, MD  Cholecalciferol (VITAMIN D) 2000 UNITS tablet Take 2,000 Units by mouth 2 (two) times a week.     Historical Provider, MD  levothyroxine (SYNTHROID, LEVOTHROID) 50 MCG tablet Take 50 mcg by mouth daily before breakfast.    Historical Provider, MD  MULTIPLE VITAMINS PO Take by mouth.    Historical Provider, MD  omeprazole (PRILOSEC) 20 MG capsule Take 20 mg by mouth at bedtime as needed (heartburn).     Historical Provider, MD  Rivaroxaban 15 & 20 MG TBPK Take as directed on package: Start with one 13m tablet by mouth twice  a day with food. On Day 22, switch to one 23m tablet once a day with food. 06/08/16   ARoxanna Mew PA-C  sildenafil (VIAGRA) 100 MG tablet Take 100 mg by mouth daily as needed for erectile dysfunction.    Historical Provider, MD    Family History Family History  Problem Relation Age of Onset  . GER disease Father     died in his 927s . Kidney disease Mother   . Diabetes Maternal Grandmother   . Heart attack Neg Hx     Social History Social History  Substance Use Topics  . Smoking status: Never Smoker  . Smokeless tobacco: Not on file  . Alcohol use Yes     Comment: twice/mo     Allergies   Review of patient's allergies indicates no known allergies.   Review of Systems Review of Systems    Constitutional: Positive for fever.  Cardiovascular: Positive for leg swelling.  Musculoskeletal: Positive for myalgias.  Skin: Positive for color change.  All other systems reviewed and are negative.    Physical Exam Updated Vital Signs BP 142/68   Pulse (!) 51   Temp 98.5 F (36.9 C) (Oral)   Resp 16   SpO2 97%   Physical Exam  Constitutional: He appears well-developed and well-nourished. No distress.  HENT:  Head: Normocephalic and atraumatic.  Mouth/Throat: Oropharynx is clear and moist. No oropharyngeal exudate.  Eyes: Conjunctivae and EOM are normal. Pupils are equal, round, and reactive to light. Right eye exhibits no discharge. Left eye exhibits no discharge. No scleral icterus.  Neck: Normal range of motion. Neck supple.  Cardiovascular: Normal rate, regular rhythm, normal heart sounds and intact distal pulses.   No murmur heard. Pulmonary/Chest: Effort normal and breath sounds normal. No respiratory distress.  Abdominal: Soft. Bowel sounds are normal. He exhibits no distension. There is no tenderness. There is no rebound and no guarding.  Musculoskeletal: He exhibits edema and tenderness.       Right lower leg: He exhibits tenderness and edema.  Lower extremity asymmetry with right lower extremity edema and warmth. No appreciable redness noted. Significant TTP of posterior calf. Palpable 2+ DP pulses. Sensation intact. Plantarflexion/dorsiflexion strength symmetric and intact.    Lymphadenopathy:    He has no cervical adenopathy.  Neurological: He is alert. Coordination normal.  Skin: Skin is warm and dry. He is not diaphoretic.  Psychiatric: He has a normal mood and affect. His behavior is normal.     ED Treatments / Results  Labs (all labs ordered are listed, but only abnormal results are displayed) Labs Reviewed  CBC - Abnormal; Notable for the following:       Result Value   WBC 12.2 (*)    All other components within normal limits  BASIC METABOLIC PANEL  - Abnormal; Notable for the following:    Sodium 127 (*)    Chloride 99 (*)    CO2 20 (*)    Glucose, Bld 104 (*)    Creatinine, Ser 1.25 (*)    Calcium 8.6 (*)    GFR calc non Af Amer 55 (*)    All other components within normal limits    EKG  EKG Interpretation None       Radiology No results found.  Procedures Procedures (including critical care time)  Medications Ordered in ED Medications  Rivaroxaban (XARELTO) tablet 15 mg (15 mg Oral Given 06/08/16 2018)  rivaroxaban ([employer] Reasons Education Kit for DVT/PE patients ( Does not apply  Given 06/08/16 2039)     Initial Impression / Assessment and Plan / ED Course  I have reviewed the triage vital signs and the nursing notes.  Pertinent labs & imaging results that were available during my care of the patient were reviewed by me and considered in my medical decision making (see chart for details).  Clinical Course  Value Comment By Time  Creatinine: (!) 1.25 (Reviewed) Roxanna Mew, PA-C 08/06 1927  WBC: (!) 12.2 (Reviewed) Roxanna Mew, [place] 08/06 1930    Patient is a pleasant 75yo male who presents from UC with concern for DVT. At [facility] patient was noted to have a fever of 101.9, he was given tylenol. In ED, patient is afebrile. Vital signs are stable. Physical exam remarkable for appreciable unilateral right lower extremity edema and warmth. TTP of posterior calf. No appreciable redness to suggest cellulitis. Pulses and sensation intact. Results of LE venous duplex US called to Dr. Eulis Foster, +DVT in peroneal and gastrocnemius veins. Creatinine elevated, but stable compared to previous. CBC remarkable for mild elevation in WBC, may be secondary to DVT. Patient denies chest pain, shortness of breath, lightheadedness, or LOC. Respirations unlabored, no hypoxia. Do not think CTA chest necessary at this time.   Discussed results and plan with patient. Discussed different anti-coagulation therapy options. After consideration  and discussion with his PCP, patient elected for Xarelto.  Spoke with pharmacy regarding renal dosing - no adjustment needed for DVT tx. First dose xarelto given in ED. Rx Xarelto starter pack. Discussed side effects, specifically bleeding risk. Emphasized return precautions. Encouraged follow up with PCP for re-evaluation. Patient voiced understanding and is agreeable.    Final Clinical Impressions(s) / ED Diagnoses   Final diagnoses:  DVT (deep venous thrombosis), right    New Prescriptions Discharge Medication List as of 06/08/2016  8:03 PM    START taking these medications   Details  Rivaroxaban 15 & 20 MG TBPK Take as directed on package: Start with one 13m tablet by mouth twice a day with food. On Day 22, switch to one 233mtablet once a day with food., Print         [place]hillPA[place]8/07/17 10Meadow WoodsMD 06/12/16 [phone]

## 2016-06-26 DIAGNOSIS — B80 Enterobiasis: Secondary | ICD-10-CM | POA: Diagnosis not present

## 2016-07-18 DIAGNOSIS — R195 Other fecal abnormalities: Secondary | ICD-10-CM | POA: Diagnosis not present

## 2016-07-18 DIAGNOSIS — B80 Enterobiasis: Secondary | ICD-10-CM | POA: Diagnosis not present

## 2016-07-31 DIAGNOSIS — R143 Flatulence: Secondary | ICD-10-CM | POA: Diagnosis not present

## 2016-07-31 DIAGNOSIS — I82401 Acute embolism and thrombosis of unspecified deep veins of right lower extremity: Secondary | ICD-10-CM | POA: Diagnosis not present

## 2016-07-31 DIAGNOSIS — R7309 Other abnormal glucose: Secondary | ICD-10-CM | POA: Diagnosis not present

## 2016-07-31 DIAGNOSIS — R739 Hyperglycemia, unspecified: Secondary | ICD-10-CM | POA: Diagnosis not present

## 2016-07-31 DIAGNOSIS — R7303 Prediabetes: Secondary | ICD-10-CM | POA: Diagnosis not present

## 2016-09-09 DIAGNOSIS — M6281 Muscle weakness (generalized): Secondary | ICD-10-CM | POA: Diagnosis not present

## 2016-09-09 DIAGNOSIS — K529 Noninfective gastroenteritis and colitis, unspecified: Secondary | ICD-10-CM | POA: Diagnosis not present

## 2016-09-09 DIAGNOSIS — I82401 Acute embolism and thrombosis of unspecified deep veins of right lower extremity: Secondary | ICD-10-CM | POA: Diagnosis not present

## 2016-09-09 DIAGNOSIS — E559 Vitamin D deficiency, unspecified: Secondary | ICD-10-CM | POA: Diagnosis not present

## 2016-09-09 DIAGNOSIS — R001 Bradycardia, unspecified: Secondary | ICD-10-CM | POA: Diagnosis not present

## 2016-09-09 DIAGNOSIS — I453 Trifascicular block: Secondary | ICD-10-CM | POA: Diagnosis not present

## 2016-09-09 DIAGNOSIS — Z23 Encounter for immunization: Secondary | ICD-10-CM | POA: Diagnosis not present

## 2016-09-09 DIAGNOSIS — L299 Pruritus, unspecified: Secondary | ICD-10-CM | POA: Diagnosis not present

## 2016-09-12 DIAGNOSIS — K529 Noninfective gastroenteritis and colitis, unspecified: Secondary | ICD-10-CM | POA: Diagnosis not present

## 2016-10-07 DIAGNOSIS — K529 Noninfective gastroenteritis and colitis, unspecified: Secondary | ICD-10-CM | POA: Diagnosis not present

## 2016-10-07 DIAGNOSIS — I82401 Acute embolism and thrombosis of unspecified deep veins of right lower extremity: Secondary | ICD-10-CM | POA: Diagnosis not present

## 2016-10-07 DIAGNOSIS — M6281 Muscle weakness (generalized): Secondary | ICD-10-CM | POA: Diagnosis not present

## 2016-10-07 DIAGNOSIS — Z23 Encounter for immunization: Secondary | ICD-10-CM | POA: Diagnosis not present

## 2016-10-07 DIAGNOSIS — L299 Pruritus, unspecified: Secondary | ICD-10-CM | POA: Diagnosis not present

## 2016-10-07 DIAGNOSIS — R001 Bradycardia, unspecified: Secondary | ICD-10-CM | POA: Diagnosis not present

## 2016-10-07 DIAGNOSIS — I453 Trifascicular block: Secondary | ICD-10-CM | POA: Diagnosis not present

## 2016-10-07 DIAGNOSIS — E559 Vitamin D deficiency, unspecified: Secondary | ICD-10-CM | POA: Diagnosis not present

## 2016-10-07 HISTORY — PX: TOOTH EXTRACTION: SUR596

## 2016-10-16 ENCOUNTER — Emergency Department (HOSPITAL_BASED_OUTPATIENT_CLINIC_OR_DEPARTMENT_OTHER): Admit: 2016-10-16 | Discharge: 2016-10-16 | Disposition: A | Discharge status: Home / Self Care | Payer: Medicare Other

## 2016-10-16 ENCOUNTER — Emergency Department (HOSPITAL_COMMUNITY)
Admission: EM | Admit: 2016-10-16 | Discharge: 2016-10-16 | Disposition: A | Discharge status: Home / Self Care | Payer: Medicare Other | Attending: Emergency Medicine | Admitting: Emergency Medicine

## 2016-10-16 ENCOUNTER — Encounter (HOSPITAL_COMMUNITY): Payer: Self-pay

## 2016-10-16 ENCOUNTER — Emergency Department (HOSPITAL_COMMUNITY): Payer: Medicare Other

## 2016-10-16 DIAGNOSIS — R739 Hyperglycemia, unspecified: Secondary | ICD-10-CM | POA: Diagnosis not present

## 2016-10-16 DIAGNOSIS — I82433 Acute embolism and thrombosis of popliteal vein, bilateral: Secondary | ICD-10-CM | POA: Diagnosis not present

## 2016-10-16 DIAGNOSIS — R0602 Shortness of breath: Secondary | ICD-10-CM | POA: Diagnosis present

## 2016-10-16 DIAGNOSIS — I82432 Acute embolism and thrombosis of left popliteal vein: Secondary | ICD-10-CM | POA: Diagnosis not present

## 2016-10-16 DIAGNOSIS — J189 Pneumonia, unspecified organism: Secondary | ICD-10-CM | POA: Diagnosis not present

## 2016-10-16 DIAGNOSIS — I82403 Acute embolism and thrombosis of unspecified deep veins of lower extremity, bilateral: Secondary | ICD-10-CM | POA: Diagnosis not present

## 2016-10-16 DIAGNOSIS — E039 Hypothyroidism, unspecified: Secondary | ICD-10-CM | POA: Insufficient documentation

## 2016-10-16 DIAGNOSIS — M79609 Pain in unspecified limb: Secondary | ICD-10-CM

## 2016-10-16 DIAGNOSIS — R079 Chest pain, unspecified: Secondary | ICD-10-CM | POA: Diagnosis not present

## 2016-10-16 DIAGNOSIS — J181 Lobar pneumonia, unspecified organism: Secondary | ICD-10-CM | POA: Diagnosis not present

## 2016-10-16 DIAGNOSIS — Z86718 Personal history of other venous thrombosis and embolism: Secondary | ICD-10-CM | POA: Diagnosis not present

## 2016-10-16 DIAGNOSIS — N183 Chronic kidney disease, stage 3 (moderate): Secondary | ICD-10-CM | POA: Diagnosis not present

## 2016-10-16 DIAGNOSIS — I2699 Other pulmonary embolism without acute cor pulmonale: Secondary | ICD-10-CM | POA: Diagnosis not present

## 2016-10-16 DIAGNOSIS — I82409 Acute embolism and thrombosis of unspecified deep veins of unspecified lower extremity: Secondary | ICD-10-CM

## 2016-10-16 DIAGNOSIS — Z7901 Long term (current) use of anticoagulants: Secondary | ICD-10-CM | POA: Insufficient documentation

## 2016-10-16 DIAGNOSIS — I82531 Chronic embolism and thrombosis of right popliteal vein: Secondary | ICD-10-CM | POA: Diagnosis not present

## 2016-10-16 HISTORY — DX: Phlebitis and thrombophlebitis of unspecified site: I80.9

## 2016-10-16 LAB — I-STAT CHEM 8, ED
BUN: 16 mg/dL (ref 6–20)
CREATININE: 1.3 mg/dL — AB (ref 0.61–1.24)
Calcium, Ion: 1.15 mmol/L (ref 1.15–1.40)
Chloride: 100 mmol/L — ABNORMAL LOW (ref 101–111)
GLUCOSE: 142 mg/dL — AB (ref 65–99)
HCT: 43 % (ref 39.0–52.0)
HEMOGLOBIN: 14.6 g/dL (ref 13.0–17.0)
Potassium: 4.2 mmol/L (ref 3.5–5.1)
Sodium: 134 mmol/L — ABNORMAL LOW (ref 135–145)
TCO2: 23 mmol/L (ref 0–100)

## 2016-10-16 LAB — CBC
HCT: 41.1 % (ref 39.0–52.0)
Hemoglobin: 14.2 g/dL (ref 13.0–17.0)
MCH: 30.7 pg (ref 26.0–34.0)
MCHC: 34.5 g/dL (ref 30.0–36.0)
MCV: 88.8 fL (ref 78.0–100.0)
PLATELETS: 250 10*3/uL (ref 150–400)
RBC: 4.63 MIL/uL (ref 4.22–5.81)
RDW: 13.8 % (ref 11.5–15.5)
WBC: 13.1 10*3/uL — ABNORMAL HIGH (ref 4.0–10.5)

## 2016-10-16 LAB — BASIC METABOLIC PANEL
Anion gap: 7 (ref 5–15)
BUN: 14 mg/dL (ref 6–20)
CALCIUM: 8.7 mg/dL — AB (ref 8.9–10.3)
CHLORIDE: 101 mmol/L (ref 101–111)
CO2: 23 mmol/L (ref 22–32)
CREATININE: 1.35 mg/dL — AB (ref 0.61–1.24)
GFR calc non Af Amer: 50 mL/min — ABNORMAL LOW (ref >60)
GFR, EST AFRICAN AMERICAN: 58 mL/min — AB (ref >60)
GLUCOSE: 135 mg/dL — AB (ref 65–99)
Potassium: 4.3 mmol/L (ref 3.5–5.1)
Sodium: 131 mmol/L — ABNORMAL LOW (ref 135–145)

## 2016-10-16 LAB — I-STAT TROPONIN, ED: TROPONIN I, POC: 0.01 ng/mL (ref 0.00–0.08)

## 2016-10-16 LAB — BRAIN NATRIURETIC PEPTIDE: B Natriuretic Peptide: 39.7 pg/mL (ref 0.0–100.0)

## 2016-10-16 MED ORDER — IOPAMIDOL (ISOVUE-370) INJECTION 76%
INTRAVENOUS | Status: AC
  Administered 2016-10-16: 100 mL
  Filled 2016-10-16: qty 100

## 2016-10-16 MED ORDER — SODIUM CHLORIDE 0.9 % IV BOLUS (SEPSIS)
1000.0000 mL | Freq: Once | INTRAVENOUS | Status: AC
  Administered 2016-10-16: 1000 mL via INTRAVENOUS

## 2016-10-16 MED ORDER — AZITHROMYCIN 250 MG PO TABS: 250.0000 mg | ORAL_TABLET | Freq: Every day | ORAL | 0 refills | Status: DC

## 2016-10-16 MED ORDER — AMOXICILLIN-POT CLAVULANATE 875-125 MG PO TABS: 1.0000 | ORAL_TABLET | Freq: Two times a day (BID) | ORAL | 0 refills | Status: AC

## 2016-10-16 MED ORDER — RIVAROXABAN (XARELTO) VTE STARTER PACK (15 & 20 MG): ORAL_TABLET | ORAL | 0 refills | Status: DC

## 2016-10-16 NOTE — ED Notes (Signed)
Hooked patient  back to the monitor after ct

## 2016-10-16 NOTE — ED Triage Notes (Signed)
Pt. sts he woke up at 3AM this Am with sharp sore pain that feels similar to when he coughs a lot. Pt. sts he woke up sweating two nights ago and ha been feeling weak for a few days. SOB that has been getting worse over the last month. Pt. Treated for a blood clot 4 months ago and recently stopped taking blood thinners.

## 2016-10-16 NOTE — Consult Note (Signed)
Medical Consultation   Terrence Beard  [id number]  DOB: April 22, 1941  DOA: 10/16/2016  PCP: Henrine Screws, MD   Outpatient Specialists: None   Requesting physician: Dr. Brett Albino - EDP  Reason for consultation: DVT/PE   History of Present Illness: Terrence Beard is an 75 y.o. male patient reports waking up in approximately 2-3 AM with sudden onset chest pain and shortness of breath. Denies any associated palpitations, radiation of the pain to his neck or arm or back, fever, diaphoresis. He should continue to rest in bed with back to sleep for approximately 1 hour. When patient woke up with similar symptoms but lesser he decided to take some of his Xarelto that was left over from his previous DVT treatment. He took 30 mg of Xarelto. Patient, back to sleep. When he woke up to get ready for the day symptoms per consisted but again were lesser than onset. Patient came to the emergency room for further evaluation. Patient currently feels at his baseline. Symptoms are not worsened by exertion. Of note patient went hunting proximally 4-5 days ago and said in a tree stand for prolonged period of time. Additionally patient spent the last 3 days driving approximately 1000 miles in his car. Patient has a history of DVT and stopped his Xarelto approximately 6 weeks ago per his PCPs instructions. Patient denies any recent lower extremity swelling. Patient does endorse mild persistent cough for the last several weeks which worsened somewhat after his hunting trip. Occasional phlegm production with cough but denies fevers. Started on amoxicillin approximately 3 days ago for treatment of a dental infection.   Review of Systems:  ROS As per HPI otherwise 10 point review of systems negative.    Past Medical History: Past Medical History:  Diagnosis Date  . Blood clot associated with vein wall inflammation    R. calf   . CKD (chronic kidney disease) stage 3, GFR 30-59 ml/min    not treated  . DJD (degenerative joint disease)   . Dysphagia   . Erectile dysfunction   . GERD (gastroesophageal reflux disease)   . Glucose intolerance (impaired glucose tolerance)   . History of echocardiogram    a. Echo 7/14:  EF 60-65%, mod asymmetric LVH, Mild LAE, mild to mod MR,   . History of nuclear stress test    a. ETT-Myoview 7/14:  EF 71%, normal perfusion, low risk  . Hypothyroidism   . Multiple lipomas   . Nasal polyposis   . Obesity   . RBBB   . Seasonal allergies     Past Surgical History: Past Surgical History:  Procedure Laterality Date  . COLONOSCOPY WITH PROPOFOL N/A 02/05/2016   Procedure: COLONOSCOPY WITH PROPOFOL;  Surgeon: Garlan Fair, MD;  Location: [facility];  Service: Endoscopy;  Laterality: N/A;  . LYMPH NODE BIOPSY    . TONSILLECTOMY    . TOOTH EXTRACTION  10/07/2016     Allergies:  No Known Allergies   Social History:  reports that he has never smoked. He has never used smokeless tobacco. He reports that he drinks alcohol. He reports that he does not use drugs.   Family History: Family History  Problem Relation Age of Onset  . GER disease Father     died in his 47s  . Kidney disease Mother   . Diabetes Maternal Grandmother   . Heart attack Neg Hx      Physical  Exam: Vitals:   10/16/16 1003 10/16/16 1015 10/16/16 1030 10/16/16 1100  BP: 142/71 128/61 147/65 141/62  Pulse: (!) 54 (!) 52 (!) 56 (!) 55  Resp: 22 22 22 14   Temp:      TempSrc:      SpO2: 95% 95% 93% 97%  Weight:      Height:        General:  Appears calm and comfortable Eyes:  PERRL, EOMI, normal lids, iris ENT:  grossly normal hearing, lips & tongue, mmm Neck:  no LAD, masses or thyromegaly Cardiovascular:  RRR, no m/r/g. Trace bilat LE edema,.  Respiratory: Few LLL crackles. nml effort.  Abdomen:  soft, ntnd, NABS Skin:  no rash or induration seen on limited exam Musculoskeletal:  grossly normal tone BUE/BLE, good ROM, no bony  abnormality Psychiatric:  grossly normal mood and affect, speech fluent and appropriate, AOx3 Neurologic:  CN 2-12 grossly intact, moves all extremities in coordinated fashion, sensation intact  Data reviewed:  I have personally reviewed following labs and imaging studies Labs:  CBC:  Recent Labs Lab 10/16/16 1013 10/16/16 1026  WBC 13.1*  --   HGB 14.2 14.6  HCT 41.1 43.0  MCV 88.8  --   PLT 250  --     Basic Metabolic Panel:  Recent Labs Lab 10/16/16 1013 10/16/16 1026  NA 131* 134*  K 4.3 4.2  CL 101 100*  CO2 23  --   GLUCOSE 135* 142*  BUN 14 16  CREATININE 1.35* 1.30*  CALCIUM 8.7*  --    GFR Estimated Creatinine Clearance: 61.3 mL/min (by C-G formula based on SCr of 1.3 mg/dL (H)). Liver Function Tests: No results for input(s): AST, ALT, ALKPHOS, BILITOT, PROT, ALBUMIN in the last 168 hours. No results for input(s): LIPASE, AMYLASE in the last 168 hours. No results for input(s): AMMONIA in the last 168 hours. Coagulation profile No results for input(s): INR, PROTIME in the last 168 hours.  Cardiac Enzymes: No results for input(s): CKTOTAL, CKMB, CKMBINDEX, TROPONINI in the last 168 hours. BNP: Invalid input(s): POCBNP CBG: No results for input(s): GLUCAP in the last 168 hours. D-Dimer No results for input(s): DDIMER in the last 72 hours. Hgb A1c No results for input(s): HGBA1C in the last 72 hours. Lipid Profile No results for input(s): CHOL, HDL, LDLCALC, TRIG, CHOLHDL, LDLDIRECT in the last 72 hours. Thyroid function studies No results for input(s): TSH, T4TOTAL, T3FREE, THYROIDAB in the last 72 hours.  Invalid input(s): FREET3 Anemia work up No results for input(s): VITAMINB12, FOLATE, FERRITIN, TIBC, IRON, RETICCTPCT in the last 72 hours. Urinalysis No results found for: COLORURINE, APPEARANCEUR, LABSPEC, [place], GLUCOSEU, HGBUR, BILIRUBINUR, KETONESUR, PROTEINUR, UROBILINOGEN, NITRITE, [place]   Microbiology No results found for this  or any previous visit (from the past 240 hour(s)).     Inpatient Medications:   Scheduled Meds: Continuous Infusions:   Radiological Exams on Admission: Dg Chest 2 View  Result Date: 10/16/2016 CLINICAL DATA:  Chest pain for several hours with shortness of breath, initial encounter EXAM: CHEST  2 VIEW COMPARISON:  None. FINDINGS: Cardiac shadow is within normal limits. The lungs are well aerated bilaterally with the exception of mild left basilar atelectasis. No focal confluent infiltrate is seen. No sizable effusion is noted. No bony abnormality is seen. IMPRESSION: Mild left basilar atelectasis. Electronically Signed   By: Inez Catalina M.D.   On: 10/16/2016 10:04   Ct Angio Chest Pe W And/or Wo Contrast  Result Date: 10/16/2016 CLINICAL DATA:  Pleuritic chest pain with difficulty with deep breathing EXAM: CT ANGIOGRAPHY CHEST WITH CONTRAST TECHNIQUE: Multidetector CT imaging of the chest was performed using the standard protocol during bolus administration of intravenous contrast. Multiplanar CT image reconstructions and MIPs were obtained to evaluate the vascular anatomy. CONTRAST:  100 mL Isovue 370 nonionic COMPARISON:  Chest radiograph October 16, 2016 FINDINGS: Cardiovascular: There are several segmental and subsegmental level pulmonary emboli bilaterally with involvement of lower lobe branches bilaterally, more severe on the left than on the right as well as involving the posterior segment right upper lobe pulmonary arterial branch. There are no main pulmonary arterial branches involved with pulmonary embolus. The right ventricle to left ventricle diameter ratio is less than 0.9, not indicative of right heart strain. There is no appreciable thoracic aortic aneurysm. The contrast bolus does not allow for assessment for potential thoracic aortic dissection ; no dissection is appreciable on the current examination. The visualized great vessels appear unremarkable. There is no appreciable  pericardial thickening. There are multiple foci of coronary artery calcification. Mediastinum/Nodes: No thyroid lesions are evident. There are scattered subcentimeter mediastinal lymph nodes. There is a lymph node anterior to the carina measuring 2.0 x 1.4 cm. No other lymph node prominence is evident. There is a small hiatal hernia. Lungs/Pleura: There is a fairly small left pleural effusion. There is a rather minimal right pleural effusion. There are foci of airspace consolidation in the superior segment of the left lower lobe as well as to a slightly lesser extent in the inferior lingula, lateral segment left lower lobe, posterior segment left lower lobe. There is mild atelectasis in the posterior right base. Upper Abdomen: In the visualized upper abdomen, there is evidence of contrast reflux into the inferior vena cava and hepatic veins. Visualized upper abdominal structures otherwise appear unremarkable. Musculoskeletal: There is degenerative change in the thoracic spine. There are no blastic or lytic bone lesions. Review of the MIP images confirms the above findings. IMPRESSION: Several segmental and subsegmental pulmonary emboli without right heart strain. There is involvement of much of the left lower lobe pulmonary artery with pulmonary embolus. Areas of airspace opacity consistent with pneumonia in the left lower lobe in several segments as well as in the inferior lingula. There may well be a degree of early infarction in the lateral segment left lower lobe. Small pleural effusions bilaterally, slightly larger on the left than on the right. Single mildly prominent precarinal lymph node, likely of reactive etiology. Multiple foci of coronary artery calcification. No thoracic aortic dissection seen. Note that the contrast bolus does not allow for optimal assessment of the aorta for potential dissection. No aneurysmal dilatation evident. Reflux of contrast into the inferior vena cava and hepatic veins may be  indicative of a degree of increased right heart pressure. Small hiatal hernia. Critical Value/emergent results were called by telephone at the time of interpretation on 10/16/2016 at 11:16 am to Dr. Duffy Bruce , who verbally acknowledged these results. Electronically Signed   By: Lowella Grip III M.D.   On: 10/16/2016 11:16    Impression/Recommendations Active Problems:   DVT (deep venous thrombosis) ([place])   Pulmonary embolism ([facility])   CAP (community acquired pneumonia)   Hyperglycemia   DVT/PE: History of DVT in the past. Start anticoagulation 6 weeks ago. I would consider this a provoked DVT given prolonged sitting during recent hunting trip in driving [id number] miles over the days preceding today's event. Imaging without any suggestion of cardiac strain. Troponin negative. Symptoms resolved.  BNP 39.7. EKG without significant change from previous. Right bundle branch block noted. No ACS. PESI score II. Discussed treatment options w/ pt regarding inpt observation and outpt. Pt would like to go home. I think this is reasonable given his low PESI score, access to medical care. Additionally patient has no significant cardiac history other than a right bundle branch block. Multiple stress tests in the past have showed no evidence of ischemia per patient report. I encouraged patient to rest throughout the remainder of the weekend. 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 Return to physical activity will need to be determined by his primary care physician. Patient is aware that any return of his symptoms he is to come back to the emergency room immediately. Patient is aware of the lethal risk of massive pulmonary embolus. Patient was examined in the presence of his son who also expressed understanding with regards to his condition and treatment plan. Based on patient's weight and renal function he can resume a normal course of Xarelto which would include 15 mg twice a day 7 days and  then 20 mg daily thereafter. Patient took 30 mg prior to presentation in the ED so he will not need to take any additional Xarelto until the a.m. on 10/17/2016. At that time he can begin 15 mg twice a day. Of note patient will now need to be on anticoagulation for life as this is his second event.  Possible pneumonia: Noted on CT scan. Patient with several week history of cough with productive sputum but denies any fevers. White count noted on labs at this time. Currently on amoxicillin for oral infection. Recommend prescribing patient 10 days of Augmentin. Patient aware that he will start this medicine and will need to stop his amoxicillin.   CKD: Codeine 1.3. This appears to be at baseline. Further management per primary care physician.  Hyperglycemia: Noted to be 135 and 142 in the ED. No history of diabetes. AB stress related. Recommend outpatient follow-up with his PCP and A1c testing.        Thank you for this consultation.  Our [facility] hospitalist team will follow the patient with you.    MERRELL, DAVID J M.D. [facility] 10/16/2016, 1:28 PM

## 2016-10-16 NOTE — ED Provider Notes (Addendum)
I saw and evaluated the patient, reviewed the resident's note and I agree with the findings and plan. Please see associated encounter note. Briefly, the patient is a 75 yo M with h/o DVT here with left leg cramping and pleuritic, acute onset CP. On arrival, VSS and WNL. Satting well on RA, mildly tachypneic. Lungs largely CTAB, with rales in LLL that clear with coughing. CXR shows atelectasis. Initial DDx: DVT/PE, chest wall pain, viral URI, less likely ACS. Pulses symmetric, doubt dissection.    EKG Interpretation  Date/Time:  Thursday October 16 2016 09:26:12 EST Ventricular Rate:  57 PR Interval:    QRS Duration: 141 QT Interval:  481 QTC Calculation: 469 R Axis:   -65 Text Interpretation:  Sinus rhythm Short PR interval Left atrial enlargement RBBB and LAFB Since last EKG, non-specific ST changes likely related to bundle branch block Otherwise no significant change Confirmed by Jajaira Ruis MD, Lysbeth Galas [phone]) on 10/16/2016 9:29:05 AM       Labs, imaging as above. CT shows multiple PE, primarily/worse in left lobar. Will admit to hospitalist for observation. Of note, pt did take Xarelto 20 mg x 2 this morning.   ADDENDUM: Consulted Dr. Marily Memos of hospitalist. Based on shared decision making discussion, pt electing for d/c home with outpt follow-up. Risks, benefits of doing so discussed. Pt remains HDS and well appearing. Satting well on RA. Will d/c home.  Duffy Bruce, MD 10/16/16 1121    Duffy Bruce, MD 10/17/16 [phone]

## 2016-10-16 NOTE — ED Provider Notes (Signed)
[facility] DEPT Provider Note   CSN: [id number] Arrival date & time: 10/16/16  [id number]     History   Chief Complaint Chief Complaint  Patient presents with  . Chest Pain  . Shortness of Breath    HPI Terrence Beard is a 75 y.o. male with a PMH of R DVT in 06/2016, CKD III, GERD, hypothyroidism presenting to the ED with chest pain and shortness of breath. At 2AM, he woke up with sudden chest pain "all over his chest" and shortness of breath. The pain was worse with a deep breath. The pain has been "constant" since onset. He denies any worsening pain with exertion. He just had a DVT in 06/2016 and stopped taking Xarelto 6 weeks ago. He denies any recent surgeries. He states that he spends a lot of time in the car, as he works in [employer]. He has spent most of the day in the car over the last 3 days. Yesterday, he traveled about 450 miles. While driving yesterday, he noticed that his left calf felt tight and sore, but he just thought it was a crap. He did not have the severe pain in his leg that he had last time he had a blood clot. He endorses recent fatigue. He denies any recent cough, congestion, rhinorrhea, changes in vision, arm/leg weakness or numbness. He took two Xarelto tablets this morning.  His family history is notable for his father with multiple blood clots.  HPI  Past Medical History:  Diagnosis Date  . Blood clot associated with vein wall inflammation    R. calf   . CKD (chronic kidney disease) stage 3, GFR 30-59 ml/min    not treated  . DJD (degenerative joint disease)   . Dysphagia   . Erectile dysfunction   . GERD (gastroesophageal reflux disease)   . Glucose intolerance (impaired glucose tolerance)   . History of echocardiogram    a. Echo 7/14:  EF 60-65%, mod asymmetric LVH, Mild LAE, mild to mod MR,   . History of nuclear stress test    a. ETT-Myoview 7/14:  EF 71%, normal perfusion, low risk  . Hypothyroidism   . Multiple lipomas   . Nasal polyposis   .  Obesity   . RBBB   . Seasonal allergies     Patient Active Problem List   Diagnosis Date Noted  . RBBB   . Hypothyroidism   . CKD (chronic kidney disease) stage 3, GFR 30-59 ml/min     Past Surgical History:  Procedure Laterality Date  . COLONOSCOPY WITH PROPOFOL N/A 02/05/2016   Procedure: COLONOSCOPY WITH PROPOFOL;  Surgeon: Garlan Fair, MD;  Location: [facility] ENDOSCOPY;  Service: Endoscopy;  Laterality: N/A;  . LYMPH NODE BIOPSY    . TONSILLECTOMY    . TOOTH EXTRACTION  10/07/2016       Home Medications    Prior to Admission medications   Medication Sig Start Date End Date Taking? Authorizing Provider  amoxicillin (AMOXIL) 875 MG tablet Take 875 mg by mouth 2 (two) times daily. 10/07/16  Yes Historical Provider, MD  calcium carbonate (TUMS - DOSED IN MG ELEMENTAL CALCIUM) 500 MG chewable tablet Chew 2-3 tablets by mouth at bedtime as needed for indigestion or heartburn.   Yes Historical Provider, MD  Cholecalciferol (VITAMIN D) 2000 UNITS tablet Take 2,000 Units by mouth 2 (two) times a week.    Yes Historical Provider, MD  levothyroxine (SYNTHROID, LEVOTHROID) 50 MCG tablet Take 50 mcg by mouth daily before breakfast.  Yes Historical Provider, MD  Rivaroxaban (XARELTO) 15 MG TABS tablet Take 15 mg by mouth 2 (two) times daily with a meal.   Yes Historical Provider, MD  sildenafil (VIAGRA) 100 MG tablet Take 100 mg by mouth daily as needed for erectile dysfunction.   Yes Historical Provider, MD  vitamin E 100 UNIT capsule Take 100 Units by mouth once a week.   Yes Historical Provider, MD  Rivaroxaban 15 & 20 MG TBPK Take as directed on package: Start with one 15mg  tablet by mouth twice a day with food. On Day 22, switch to one 20mg  tablet once a day with food. Patient not taking: Reported on 10/16/2016 06/08/16   Roxanna Mew, PA-C    Family History Family History  Problem Relation Age of Onset  . GER disease Father     died in his 69s  . Kidney disease Mother   .  Diabetes Maternal Grandmother   . Heart attack Neg Hx     Social History Social History  Substance Use Topics  . Smoking status: Never Smoker  . Smokeless tobacco: Never Used  . Alcohol use Yes     Comment: twice/mo     Allergies   Patient has no known allergies.   Review of Systems Review of Systems 10 Systems reviewed and are negative for acute change except as noted in the HPI.  Physical Exam Updated Vital Signs BP 141/62   Pulse (!) 55   Temp 98.6 F (37 C) (Oral)   Resp 14   Ht 5\' 11"  (1.803 m)   Wt 104.3 kg   SpO2 97%   BMI 32.08 kg/m   Physical Exam  Constitutional: He is oriented to person, place, and time. He appears well-developed and well-nourished. No distress.  HENT:  Head: Normocephalic and atraumatic.  Mouth/Throat: Oropharynx is clear and moist.  Eyes: EOM are normal. Pupils are equal, round, and reactive to light.  Neck: Normal range of motion. Neck supple. No JVD present.  Cardiovascular: Normal rate and regular rhythm.   No murmur heard. Chest pain is not reproducible on palpation of the sternum  Pulmonary/Chest: Effort normal and breath sounds normal. No respiratory distress. He has no wheezes.  Abdominal: Soft. Bowel sounds are normal. He exhibits no distension. There is no tenderness. There is no rebound and no guarding.  Musculoskeletal: Normal range of motion.  Right lower leg measuring 41cm in circumference and left lower leg measuring 43cm in circumference. Left lower leg is warm and mildly tender to palpation. No erythema. Very mild pitting edema bilaterally.  Neurological: He is alert and oriented to person, place, and time. No cranial nerve deficit. He exhibits normal muscle tone.  Skin: Skin is warm and dry.  Psychiatric: He has a normal mood and affect. His behavior is normal. Thought content normal.    ED Treatments / Results  Labs (all labs ordered are listed, but only abnormal results are displayed) Labs Reviewed  BASIC  METABOLIC PANEL - Abnormal; Notable for the following:       Result Value   Sodium 131 (*)    Glucose, Bld 135 (*)    Creatinine, Ser 1.35 (*)    Calcium 8.7 (*)    GFR calc non Af Amer 50 (*)    GFR calc Af Amer 58 (*)    All other components within normal limits  CBC - Abnormal; Notable for the following:    WBC 13.1 (*)    All other components within normal  limits  I-STAT CHEM 8, ED - Abnormal; Notable for the following:    Sodium 134 (*)    Chloride 100 (*)    Creatinine, Ser 1.30 (*)    Glucose, Bld 142 (*)    All other components within normal limits  BRAIN NATRIURETIC PEPTIDE  I-STAT TROPOININ, ED    EKG  EKG Interpretation  Date/Time:  Thursday October 16 2016 09:26:12 EST Ventricular Rate:  57 PR Interval:    QRS Duration: 141 QT Interval:  481 QTC Calculation: 469 R Axis:   -65 Text Interpretation:  Sinus rhythm Short PR interval Left atrial enlargement RBBB and LAFB Since last EKG, non-specific ST changes likely related to bundle branch block Otherwise no significant change Confirmed by Ellender Hose MD, Lysbeth Galas [phone]) on 10/16/2016 9:29:05 AM Also confirmed by Ellender Hose MD, CAMERON [phone]), editor [place], Joelene Millin [phone])  on 10/16/2016 10:30:40 AM       Radiology Dg Chest 2 View  Result Date: 10/16/2016 CLINICAL DATA:  Chest pain for several hours with shortness of breath, initial encounter EXAM: CHEST  2 VIEW COMPARISON:  None. FINDINGS: Cardiac shadow is within normal limits. The lungs are well aerated bilaterally with the exception of mild left basilar atelectasis. No focal confluent infiltrate is seen. No sizable effusion is noted. No bony abnormality is seen. IMPRESSION: Mild left basilar atelectasis. Electronically Signed   By: Inez Catalina M.D.   On: 10/16/2016 10:04   Ct Angio Chest Pe W And/or Wo Contrast  Result Date: 10/16/2016 CLINICAL DATA:  Pleuritic chest pain with difficulty with deep breathing EXAM: CT ANGIOGRAPHY CHEST WITH CONTRAST TECHNIQUE:  Multidetector CT imaging of the chest was performed using the standard protocol during bolus administration of intravenous contrast. Multiplanar CT image reconstructions and MIPs were obtained to evaluate the vascular anatomy. CONTRAST:  100 mL Isovue 370 nonionic COMPARISON:  Chest radiograph October 16, 2016 FINDINGS: Cardiovascular: There are several segmental and subsegmental level pulmonary emboli bilaterally with involvement of lower lobe branches bilaterally, more severe on the left than on the right as well as involving the posterior segment right upper lobe pulmonary arterial branch. There are no main pulmonary arterial branches involved with pulmonary embolus. The right ventricle to left ventricle diameter ratio is less than 0.9, not indicative of right heart strain. There is no appreciable thoracic aortic aneurysm. The contrast bolus does not allow for assessment for potential thoracic aortic dissection ; no dissection is appreciable on the current examination. The visualized great vessels appear unremarkable. There is no appreciable pericardial thickening. There are multiple foci of coronary artery calcification. Mediastinum/Nodes: No thyroid lesions are evident. There are scattered subcentimeter mediastinal lymph nodes. There is a lymph node anterior to the carina measuring 2.0 x 1.4 cm. No other lymph node prominence is evident. There is a small hiatal hernia. Lungs/Pleura: There is a fairly small left pleural effusion. There is a rather minimal right pleural effusion. There are foci of airspace consolidation in the superior segment of the left lower lobe as well as to a slightly lesser extent in the inferior lingula, lateral segment left lower lobe, posterior segment left lower lobe. There is mild atelectasis in the posterior right base. Upper Abdomen: In the visualized upper abdomen, there is evidence of contrast reflux into the inferior vena cava and hepatic veins. Visualized upper abdominal  structures otherwise appear unremarkable. Musculoskeletal: There is degenerative change in the thoracic spine. There are no blastic or lytic bone lesions. Review of the MIP images confirms the above findings. IMPRESSION:  Several segmental and subsegmental pulmonary emboli without right heart strain. There is involvement of much of the left lower lobe pulmonary artery with pulmonary embolus. Areas of airspace opacity consistent with pneumonia in the left lower lobe in several segments as well as in the inferior lingula. There may well be a degree of early infarction in the lateral segment left lower lobe. Small pleural effusions bilaterally, slightly larger on the left than on the right. Single mildly prominent precarinal lymph node, likely of reactive etiology. Multiple foci of coronary artery calcification. No thoracic aortic dissection seen. Note that the contrast bolus does not allow for optimal assessment of the aorta for potential dissection. No aneurysmal dilatation evident. Reflux of contrast into the inferior vena cava and hepatic veins may be indicative of a degree of increased right heart pressure. Small hiatal hernia. Critical Value/emergent results were called by telephone at the time of interpretation on 10/16/2016 at 11:16 am to Dr. Duffy Bruce , who verbally acknowledged these results. Electronically Signed   By: Lowella Grip III M.D.   On: 10/16/2016 11:16    Procedures Procedures (including critical care time)  Medications Ordered in ED Medications  sodium chloride 0.9 % bolus 1,000 mL (1,000 mLs Intravenous New Bag/Given 10/16/16 1109)  iopamidol (ISOVUE-370) 76 % injection (100 mLs  Contrast Given 10/16/16 1049)     Initial Impression / Assessment and Plan / ED Course  I have reviewed the triage vital signs and the nursing notes.  Pertinent labs & imaging results that were available during my care of the patient were reviewed by me and considered in my medical decision  making (see chart for details).  Clinical Course    Rush Landmark is a 75 year old male with a PMH of R DVT in 06/2016, CKD III, GERD, and hypothyroidism presenting to the ED with sudden onset pleuritic chest pain and shortness of breath that started at 2AM this morning. He stopped taking Xarelto 6 weeks ago. He spends a lot of time in his car traveling for work. He notes left calf soreness that started yesterday. On exam, his left lower leg is warm and is more swollen in comparison to the right. PE/DVT is highest on the differential. Wells score is a 9.0. Will get an I-stat chem 8 to check kidney function and then order CTA chest to rule out PE. Will also order dopplers of the lower extremities to look for DVT. Pt is currently hemodynamically stable with a BP 142/71 and O2 sats 95% on RA, so no need to start Heparin gtt emergently. ACS less likely as it sounds like his chest pain is pleuritic in nature. EKG without signs of STEMI. Dissection less likely, as Pt does not describe ripping or tearing chest pain and has symmetric pulses. Pulmonary process like pneumonia ruled out with negative CXR.  10:30AM: Trop 0.01. Cr 1.30, creatinine clearance 73. Will order CTA chest and a bolus of fluids.  11:20AM: CTA chest with multiple segmental and subsegmental pulmonary emboli. Pt took two tablets of Xarelto 15mg  between 4:00am and 6:00am. Discussed with pharmacy, who states they do not start heparin until 24 hours after the last Xarelto dose. CTA chest also with some pneumonia, but Pt has not had any fevers or cough. Will hold off on antibiotics for now.  12:00PM: Duplex US with bilateral popliteal DVTs.  12:28PM: Spoke with Dr. Marily Memos, [facility], who will come see the patient.  1:10PM: Discussed with Dr. Marily Memos who feels comfortable sending the patient home. Pt  will need to start course of Xarelto tomorrow morning, as he already took 30mg  early this morning. Will also discharge him with Augmentin for possible  pneumonia.  Final Clinical Impressions(s) / ED Diagnoses   Final diagnoses:  Leg DVT (deep venous thromboembolism), acute, bilateral ([place])  Other pulmonary embolism without acute cor pulmonale, unspecified chronicity ([facility])    New Prescriptions New Prescriptions   No medications on file     Sela Hua, MD 10/16/16 1230    Sela Hua, MD 10/16/16 [place], MD 10/16/16 [place], MD 10/17/16 [phone]

## 2016-10-16 NOTE — ED Notes (Signed)
Placed patient into a gown and on the monitor did ekg shown to er Dr Ellender Hose

## 2016-10-16 NOTE — Progress Notes (Addendum)
*  Preliminary Results* Bilateral lower extremity venous duplex completed. Bilateral lower extremities are positive for acute deep vein thrombosis involving bilateral posterior tibial and peroneal veins.  When compared to prior right lower extremity venous duplex study from 06/08/16, DVT in posterior tibial veins is new, and DVT previously seen in right gastrocnemius veins appears to have resolved.  Prior left lower extremity venous duplex is not available for comparison.  There is no evidence of Baker's cyst bilaterally.  Preliminary results discussed with Dr. Ellender Hose.  10/16/2016 11:44 AM Maudry Mayhew, BS, RVT, RDCS, RDMS

## 2016-10-28 ENCOUNTER — Emergency Department (HOSPITAL_COMMUNITY): Payer: Medicare Other

## 2016-10-28 ENCOUNTER — Encounter (HOSPITAL_COMMUNITY): Payer: Self-pay | Admitting: Neurology

## 2016-10-28 ENCOUNTER — Emergency Department (HOSPITAL_COMMUNITY)
Admission: EM | Admit: 2016-10-28 | Discharge: 2016-10-28 | Disposition: A | Discharge status: Home / Self Care | Payer: Medicare Other | Attending: Emergency Medicine | Admitting: Emergency Medicine

## 2016-10-28 DIAGNOSIS — E039 Hypothyroidism, unspecified: Secondary | ICD-10-CM | POA: Diagnosis not present

## 2016-10-28 DIAGNOSIS — R079 Chest pain, unspecified: Secondary | ICD-10-CM | POA: Diagnosis not present

## 2016-10-28 DIAGNOSIS — N183 Chronic kidney disease, stage 3 (moderate): Secondary | ICD-10-CM | POA: Insufficient documentation

## 2016-10-28 DIAGNOSIS — Z7901 Long term (current) use of anticoagulants: Secondary | ICD-10-CM | POA: Diagnosis not present

## 2016-10-28 DIAGNOSIS — Z79899 Other long term (current) drug therapy: Secondary | ICD-10-CM | POA: Diagnosis not present

## 2016-10-28 DIAGNOSIS — R0602 Shortness of breath: Secondary | ICD-10-CM | POA: Diagnosis not present

## 2016-10-28 LAB — CBC WITH DIFFERENTIAL/PLATELET
Basophils Absolute: 0 10*3/uL (ref 0.0–0.1)
Basophils Relative: 0 %
Eosinophils Absolute: 0.3 10*3/uL (ref 0.0–0.7)
Eosinophils Relative: 2 %
HCT: 44 % (ref 39.0–52.0)
Hemoglobin: 14.8 g/dL (ref 13.0–17.0)
Lymphocytes Relative: 15 %
Lymphs Abs: 1.9 10*3/uL (ref 0.7–4.0)
MCH: 30 pg (ref 26.0–34.0)
MCHC: 33.6 g/dL (ref 30.0–36.0)
MCV: 89.1 fL (ref 78.0–100.0)
Monocytes Absolute: 0.6 10*3/uL (ref 0.1–1.0)
Monocytes Relative: 5 %
Neutro Abs: 10 10*3/uL — ABNORMAL HIGH (ref 1.7–7.7)
Neutrophils Relative %: 78 %
Platelets: 501 10*3/uL — ABNORMAL HIGH (ref 150–400)
RBC: 4.94 MIL/uL (ref 4.22–5.81)
RDW: 13.2 % (ref 11.5–15.5)
WBC: 12.8 10*3/uL — ABNORMAL HIGH (ref 4.0–10.5)

## 2016-10-28 LAB — BASIC METABOLIC PANEL
Anion gap: 7 (ref 5–15)
BUN: 18 mg/dL (ref 6–20)
CO2: 25 mmol/L (ref 22–32)
Calcium: 9.1 mg/dL (ref 8.9–10.3)
Chloride: 102 mmol/L (ref 101–111)
Creatinine, Ser: 1.36 mg/dL — ABNORMAL HIGH (ref 0.61–1.24)
GFR calc Af Amer: 58 mL/min — ABNORMAL LOW (ref >60)
GFR calc non Af Amer: 50 mL/min — ABNORMAL LOW (ref >60)
Glucose, Bld: 222 mg/dL — ABNORMAL HIGH (ref 65–99)
Potassium: 5 mmol/L (ref 3.5–5.1)
Sodium: 134 mmol/L — ABNORMAL LOW (ref 135–145)

## 2016-10-28 LAB — TROPONIN I: Troponin I: <0.03 ng/mL (ref <0.03)

## 2016-10-28 MED ORDER — TRAMADOL HCL 50 MG PO TABS: 50.0000 mg | ORAL_TABLET | Freq: Four times a day (QID) | ORAL | 0 refills | Status: DC | PRN

## 2016-10-28 MED ORDER — RIVAROXABAN (XARELTO) VTE STARTER PACK (15 & 20 MG): ORAL_TABLET | ORAL | 0 refills | Status: DC

## 2016-10-28 MED ORDER — AMOXICILLIN 500 MG PO CAPS: 500.0000 mg | ORAL_CAPSULE | Freq: Three times a day (TID) | ORAL | 0 refills | Status: DC

## 2016-10-28 MED ORDER — AZITHROMYCIN 250 MG PO TABS: 250.0000 mg | ORAL_TABLET | Freq: Every day | ORAL | 0 refills | Status: DC

## 2016-10-28 MED ORDER — HYDROMORPHONE HCL 2 MG/ML IJ SOLN
1.0000 mg | Freq: Once | INTRAMUSCULAR | Status: AC
Start: 2016-10-28 — End: 2016-10-28
  Administered 2016-10-28: 1 mg via INTRAVENOUS
  Filled 2016-10-28: qty 1

## 2016-10-28 MED ORDER — DEXTROSE 5 % IV SOLN
500.0000 mg | Freq: Once | INTRAVENOUS | Status: AC
  Administered 2016-10-28: 500 mg via INTRAVENOUS
  Filled 2016-10-28: qty 500

## 2016-10-28 MED ORDER — DEXTROSE 5 % IV SOLN
1.0000 g | Freq: Once | INTRAVENOUS | Status: AC
  Administered 2016-10-28: 1 g via INTRAVENOUS
  Filled 2016-10-28: qty 10

## 2016-10-28 NOTE — ED Provider Notes (Signed)
[facility] DEPT Provider Note     By signing my name below, I, Bea Graff, attest that this documentation has been prepared under the direction and in the presence of Virgel Manifold, MD. Electronically Signed: Bea Graff, ED Scribe. 10/28/16. 12:07 PM.     History   Chief Complaint Chief Complaint  Patient presents with  . Chest Pain  . Shortness of Breath    The history is provided by the patient. No language interpreter was used.    HPI Comments:  Terrence Beard is a 75 y.o. male s/p DVT and PE who presents to the Emergency Department complaining of worsening SOB that began last night. He reports associated severe stabbing left sided CP. He states he had a PE about two weeks ago and has been on Xarelto daily since. He reports having a DVT in the RLE about 4 months ago and was on Xarelto for almost three months. He then got a DVT in the LLE after discontinuing the Xarelto. He states he was restarted on the medication about two weeks ago secondary to the PE. Deep breathing and movement increases his pain. He denies alleviating factors. He has not taken anything for pain. He denies lower extremity pain, dizziness, light-headedness, calf pain. Other PMHx includes RBBB, CKD and hypothyroidism. He states his cardiologist is at [facility].   Past Medical History:  Diagnosis Date  . Blood clot associated with vein wall inflammation    R. calf   . CKD (chronic kidney disease) stage 3, GFR 30-59 ml/min    not treated  . DJD (degenerative joint disease)   . Dysphagia   . Erectile dysfunction   . GERD (gastroesophageal reflux disease)   . Glucose intolerance (impaired glucose tolerance)   . History of echocardiogram    a. Echo 7/14:  EF 60-65%, mod asymmetric LVH, Mild LAE, mild to mod MR,   . History of nuclear stress test    a. ETT-Myoview 7/14:  EF 71%, normal perfusion, low risk  . Hypothyroidism   . Multiple lipomas   . Nasal polyposis   . Obesity   .  RBBB   . Seasonal allergies     Patient Active Problem List   Diagnosis Date Noted  . DVT (deep venous thrombosis) ([place]) 10/16/2016  . Pulmonary embolism ([place]) 10/16/2016  . CAP (community acquired pneumonia) 10/16/2016  . Hyperglycemia 10/16/2016  . Other pulmonary embolism without acute cor pulmonale ([place])   . RBBB   . Hypothyroidism   . CKD (chronic kidney disease)     Past Surgical History:  Procedure Laterality Date  . COLONOSCOPY WITH PROPOFOL N/A 02/05/2016   Procedure: COLONOSCOPY WITH PROPOFOL;  Surgeon: Garlan Fair, MD;  Location: [facility];  Service: Endoscopy;  Laterality: N/A;  . LYMPH NODE BIOPSY    . TONSILLECTOMY    . TOOTH EXTRACTION  10/07/2016       Home Medications    Prior to Admission medications   Medication Sig Start Date End Date Taking? Authorizing Provider  calcium carbonate (TUMS - DOSED IN MG ELEMENTAL CALCIUM) 500 MG chewable tablet Chew 2-3 tablets by mouth at bedtime as needed for indigestion or heartburn.   Yes Historical Provider, MD  Cholecalciferol (VITAMIN D) 2000 UNITS tablet Take 2,000 Units by mouth 2 (two) times a week.    Yes Historical Provider, MD  levothyroxine (SYNTHROID, LEVOTHROID) 50 MCG tablet Take 50 mcg by mouth daily before breakfast.   Yes Historical Provider, MD  Rivaroxaban (XARELTO) 15 MG  TABS tablet Take 15 mg by mouth daily with supper.   Yes Historical Provider, MD  sildenafil (VIAGRA) 100 MG tablet Take 100 mg by mouth daily as needed for erectile dysfunction.   Yes Historical Provider, MD  vitamin E 100 UNIT capsule Take 100 Units by mouth once a week.   Yes Historical Provider, MD  azithromycin (ZITHROMAX) 250 MG tablet Take 1 tablet (250 mg total) by mouth daily. Take first 2 tablets together, then 1 every day until finished. Patient not taking: Reported on 10/28/2016 10/16/16   Duffy Bruce, MD  Rivaroxaban 15 & 20 MG TBPK Take as directed on package: Start with one 15mg  tablet by mouth twice a day with  food. On Day 22, switch to one 20mg  tablet once a day with food. Patient not taking: Reported on 10/28/2016 10/16/16   Duffy Bruce, MD    Family History Family History  Problem Relation Age of Onset  . GER disease Father     died in his 34s  . Kidney disease Mother   . Diabetes Maternal Grandmother   . Heart attack Neg Hx     Social History Social History  Substance Use Topics  . Smoking status: Never Smoker  . Smokeless tobacco: Never Used  . Alcohol use Yes     Comment: twice/mo     Allergies   Patient has no known allergies.   Review of Systems Review of Systems A complete 10 system review of systems was obtained and all systems are negative except as noted in the HPI and PMH.    Physical Exam Updated Vital Signs BP 161/88 (BP Location: Left Arm)   Pulse 66   Temp 98.5 F (36.9 C) (Oral)   Resp 18   SpO2 98%   Physical Exam  Constitutional: He is oriented to person, place, and time. He appears well-developed and well-nourished.  HENT:  Head: Normocephalic.  Eyes: EOM are normal.  Neck: Normal range of motion.  Cardiovascular: Normal rate, regular rhythm and normal heart sounds.  Exam reveals no gallop and no friction rub.   No murmur heard. Pulmonary/Chest: Effort normal and breath sounds normal. No respiratory distress. He has no wheezes. He has no rales.  Abdominal: He exhibits no distension.  Musculoskeletal: Normal range of motion.  Neurological: He is alert and oriented to person, place, and time.  Psychiatric: He has a normal mood and affect.  Nursing note and vitals reviewed.    ED Treatments / Results  DIAGNOSTIC STUDIES: Oxygen Saturation is 98% on RA, normal by my interpretation.   COORDINATION OF CARE: 9:31 AM- Will order scans and pain medication. Pt verbalizes understanding and agrees to plan.  Medications  cefTRIAXone (ROCEPHIN) 1 g in dextrose 5 % 50 mL IVPB (0 g Intravenous Stopped 10/28/16 1038)  azithromycin (ZITHROMAX) 500  mg in dextrose 5 % 250 mL IVPB (0 mg Intravenous Stopped 10/28/16 1159)  HYDROmorphone (DILAUDID) injection 1 mg (1 mg Intravenous Given 10/28/16 1001)     Labs (all labs ordered are listed, but only abnormal results are displayed) Labs Reviewed  CBC WITH DIFFERENTIAL/PLATELET - Abnormal; Notable for the following:       Result Value   WBC 12.8 (*)    Platelets 501 (*)    Neutro Abs 10.0 (*)    All other components within normal limits  BASIC METABOLIC PANEL - Abnormal; Notable for the following:    Sodium 134 (*)    Glucose, Bld 222 (*)    Creatinine, Ser  1.36 (*)    GFR calc non Af Amer 50 (*)    GFR calc Af Amer 58 (*)    All other components within normal limits  TROPONIN I    EKG  EKG Interpretation  Date/Time:  Tuesday October 28 2016 08:29:39 EST Ventricular Rate:  63 PR Interval:    QRS Duration: 138 QT Interval:  452 QTC Calculation: 462 R Axis:   -75 Text Interpretation:  Sinus tachycardia with 2nd degree A-V block (Mobitz I) with 2:1 A-V conduction Right bundle branch block Left anterior fascicular block  Bifascicular block  Abnormal ECG Confirmed by Wilson Singer  MD, Adisyn Ruscitti [phone]) on 10/28/2016 9:05:53 AM       Radiology Dg Chest 2 View  Result Date: 10/28/2016 CLINICAL DATA:  Increased shortness of breath and left chest pain, recent acute pulmonary emboli 10/16/2016 EXAM: CHEST  2 VIEW COMPARISON:  10/16/2016 FINDINGS: Increased left lower lobe dense consolidation and associated left effusion. This is in the region of recent left lower lobe acute pulmonary emboli by CT 10/16/2016. Therefore, the consolidation may represent a combination of pulmonary infarct/pneumonia and partial collapse. Right lung remains clear. No pneumothorax. Trachea is midline. Atherosclerosis noted of the aorta. Degenerative change present of the spine. IMPRESSION: Increased left lower lobe dense consolidation and small left effusion, compatible with a combination of pulmonary  infarct/pneumonia and partial collapse. Associated small left effusion. Thoracic aortic atherosclerosis Electronically Signed   By: Jerilynn Mages.  Shick M.D.   On: 10/28/2016 09:17    Procedures Procedures (including critical care time)  Medications Ordered in ED Medications  cefTRIAXone (ROCEPHIN) 1 g in dextrose 5 % 50 mL IVPB (0 g Intravenous Stopped 10/28/16 1038)  azithromycin (ZITHROMAX) 500 mg in dextrose 5 % 250 mL IVPB (0 mg Intravenous Stopped 10/28/16 1159)  HYDROmorphone (DILAUDID) injection 1 mg (1 mg Intravenous Given 10/28/16 1001)     Initial Impression / Assessment and Plan / ED Course  I have reviewed the triage vital signs and the nursing notes.  Pertinent labs & imaging results that were available during my care of the patient were reviewed by me and considered in my medical decision making (see chart for details).  Clinical Course     75yM with CP. Recently diagnosed PE. CXR with infarct versus pneumonia. Probably infarct but cannot exclude infection. Will give course of abx.   Additionally, he is under dosing xarelto. He has tablets from both recent diagnosis and prior DVT. Some confusion on his part as how to take. He is taking 15mg  tablets "every 18 hours." While this dose equates to 20mg  daily, it is both under dosing and also more complicated than it should be. Pt advised that he should be taking 15mg  BID for the first 21 days and THEN change to 20mg  daily.   He is having pain, but he is in NAD. O2 sats adequate on RA. Bradycardia noted, but he doesn't seem to be symptomatic in this regard. EKG with known AV block. BP fine. I doubt ACS, dissection or other emergent process.  Family with multiple concerns and several good questions. Some related to repeat CT. I do not think this would change management. He is being placed on abx and also appropriate dosing on rivaroxaban. Also need to consider the risk on contrast induced nephropathy particularly with his baseline mild renal  impairment.   It has been determined that no acute conditions requiring further emergency intervention are present at this time. The patient has been advised of the diagnosis  and plan. I reviewed any labs and imaging including any potential incidental findings. We have discussed signs and symptoms that warrant return to the ED and they are listed in the discharge instructions.    I personally preformed the services scribed in my presence. The recorded information has been reviewed is accurate. Virgel Manifold, MD.   Final Clinical Impressions(s) / ED Diagnoses   Final diagnoses:  Chest pain, unspecified type   I personally preformed the services scribed in my presence. The recorded information has been reviewed is accurate. Virgel Manifold, MD.   New Prescriptions Discharge Medication List as of 10/28/2016 12:24 PM    START taking these medications   Details  amoxicillin (AMOXIL) 500 MG capsule Take 1 capsule (500 mg total) by mouth 3 (three) times daily., Starting Tue 10/28/2016, Print    !! azithromycin (ZITHROMAX) 250 MG tablet Take 1 tablet (250 mg total) by mouth daily. Take first 2 tablets together, then 1 every day until finished., Starting Tue 10/28/2016, Print     !! - Potential duplicate medications found. Please discuss with provider.       Virgel Manifold, MD 10/30/16 [phone]

## 2016-10-28 NOTE — ED Notes (Signed)
Pt and family have several questions regarding tests, Dr. Wilson Singer aware.

## 2016-10-28 NOTE — Discharge Instructions (Signed)
You should be taking 15mg  of xarelto twice a day (30mg  total per day) for the first two weeks. After 21 days you then switch to 20 mg once daily. If you are not taking this currently, then you need to. Take antibiotics as prescribed.

## 2016-10-28 NOTE — ED Triage Notes (Addendum)
Pt here with several dx blood clots in his lungs and is taking xarelto for 2 weeks. Has been having cp and sob but got a lot worse last night. Pt reports he can't even walk to the car b/c he gets so SOB.

## 2016-10-28 NOTE — ED Notes (Signed)
Pt resting quietly at the time. Vital signs stable. NAD 

## 2016-10-28 NOTE — ED Notes (Addendum)
Pt states he was diagnosed with PE on 10/16/16, was placed on Xarelto. Now reports increased SOB and chest pain. Pt states he is feeling very run down and weak. Respirations unlabored at present.

## 2016-10-31 DIAGNOSIS — I2699 Other pulmonary embolism without acute cor pulmonale: Secondary | ICD-10-CM | POA: Diagnosis not present

## 2016-10-31 DIAGNOSIS — I82409 Acute embolism and thrombosis of unspecified deep veins of unspecified lower extremity: Secondary | ICD-10-CM | POA: Diagnosis not present

## 2016-11-05 DIAGNOSIS — F419 Anxiety disorder, unspecified: Secondary | ICD-10-CM | POA: Diagnosis not present

## 2016-11-05 DIAGNOSIS — I2699 Other pulmonary embolism without acute cor pulmonale: Secondary | ICD-10-CM | POA: Diagnosis not present

## 2016-11-05 DIAGNOSIS — I82409 Acute embolism and thrombosis of unspecified deep veins of unspecified lower extremity: Secondary | ICD-10-CM | POA: Diagnosis not present

## 2016-11-07 DIAGNOSIS — D6851 Activated protein C resistance: Secondary | ICD-10-CM | POA: Diagnosis not present

## 2016-11-12 DIAGNOSIS — R079 Chest pain, unspecified: Secondary | ICD-10-CM | POA: Diagnosis not present

## 2016-11-12 DIAGNOSIS — I4891 Unspecified atrial fibrillation: Secondary | ICD-10-CM | POA: Diagnosis not present

## 2016-11-12 DIAGNOSIS — D6851 Activated protein C resistance: Secondary | ICD-10-CM | POA: Diagnosis not present

## 2016-11-13 ENCOUNTER — Ambulatory Visit
Admission: RE | Admit: 2016-11-13 | Discharge: 2016-11-13 | Disposition: A | Discharge status: Home / Self Care | Payer: Medicare Other | Source: Ambulatory Visit | Attending: Internal Medicine | Admitting: Internal Medicine

## 2016-11-13 ENCOUNTER — Other Ambulatory Visit: Payer: Self-pay | Admitting: Internal Medicine

## 2016-11-13 DIAGNOSIS — D6851 Activated protein C resistance: Secondary | ICD-10-CM

## 2016-11-13 DIAGNOSIS — R05 Cough: Secondary | ICD-10-CM | POA: Diagnosis not present

## 2016-11-13 DIAGNOSIS — R0602 Shortness of breath: Secondary | ICD-10-CM | POA: Diagnosis not present

## 2016-11-19 ENCOUNTER — Ambulatory Visit: Payer: Medicare Other | Admitting: Cardiology

## 2016-12-05 ENCOUNTER — Encounter: Payer: Self-pay | Admitting: Cardiology

## 2016-12-05 ENCOUNTER — Encounter (INDEPENDENT_AMBULATORY_CARE_PROVIDER_SITE_OTHER): Payer: Self-pay

## 2016-12-05 ENCOUNTER — Ambulatory Visit (INDEPENDENT_AMBULATORY_CARE_PROVIDER_SITE_OTHER): Payer: Medicare Other | Admitting: Cardiology

## 2016-12-05 VITALS — BP 128/76 | HR 55 | Ht 71.0 in | Wt 235.2 lb

## 2016-12-05 DIAGNOSIS — I453 Trifascicular block: Secondary | ICD-10-CM

## 2016-12-05 NOTE — Progress Notes (Addendum)
Electrophysiology Office Note   Date:  12/05/2016   ID:  Martez, Wurzer 06-28-1941, MRN [id number]  PCP:  Henrine Screws, MD  Cardiologist:  Richardson Dopp Primary Electrophysiologist:  Maccoy Haubner Meredith Leeds, MD    Chief Complaint  Patient presents with  . Follow-up    Trifascicular block/Afib     History of Present Illness: Maxell Miggins is a 76 y.o. male who presents today for electrophysiology evaluation.   He has a history of long standing RBBB and was found to have trifascicular block on a recent ECG.  He has periods of mobitz I block as well as first degree AV block.  He is currently asymptomatic.  He has no dizziness but does endorse shortness of breath when walking up hills.  He is able to hunt and carry his guns and ammo through the woods without issues.  He had an event monitor showing [name] with a severely prolonged PR interval.  He recently had a pulmonary embolism and is currently taking is Xarelto. Since being put on his anticoagulation, he is felt well. He is trying to get back to exercising.  Today, he denies symptoms of palpitations, chest pain, shortness of breath, orthopnea, PND, lower extremity edema, claudication, dizziness, presyncope, syncope, bleeding, or neurologic sequela. The patient is tolerating medications without difficulties and is otherwise without complaint today.    Past Medical History:  Diagnosis Date  . Blood clot associated with vein wall inflammation    R. calf   . CKD (chronic kidney disease) stage 3, GFR 30-59 ml/min    not treated  . DJD (degenerative joint disease)   . Dysphagia   . Erectile dysfunction   . GERD (gastroesophageal reflux disease)   . Glucose intolerance (impaired glucose tolerance)   . History of echocardiogram    a. Echo 7/14:  EF 60-65%, mod asymmetric LVH, Mild LAE, mild to mod MR,   . History of nuclear stress test    a. ETT-Myoview 7/14:  EF 71%, normal perfusion, low risk  . Hypothyroidism     . Multiple lipomas   . Nasal polyposis   . Obesity   . RBBB   . Seasonal allergies    Past Surgical History:  Procedure Laterality Date  . COLONOSCOPY WITH PROPOFOL N/A 02/05/2016   Procedure: COLONOSCOPY WITH PROPOFOL;  Surgeon: Garlan Fair, MD;  Location: [facility];  Service: Endoscopy;  Laterality: N/A;  . LYMPH NODE BIOPSY    . TONSILLECTOMY    . TOOTH EXTRACTION  10/07/2016     Current Outpatient Prescriptions  Medication Sig Dispense Refill  . calcium carbonate (TUMS - DOSED IN MG ELEMENTAL CALCIUM) 500 MG chewable tablet Chew 2-3 tablets by mouth at bedtime as needed for indigestion or heartburn.    . Cholecalciferol (VITAMIN D) 2000 UNITS tablet Take 2,000 Units by mouth 2 (two) times a week.     . levothyroxine (SYNTHROID, LEVOTHROID) 50 MCG tablet Take 50 mcg by mouth daily before breakfast.    . Rivaroxaban 15 & 20 MG TBPK Take as directed on package: Start with one 15mg  tablet by mouth twice a day with food. On Day 22, switch to one 20mg  tablet once a day with food. 51 each 0  . sildenafil (VIAGRA) 100 MG tablet Take 100 mg by mouth daily as needed for erectile dysfunction.    . vitamin E 100 UNIT capsule Take 100 Units by mouth once a week.     No current facility-administered medications for this  visit.     Allergies:   Patient has no known allergies.   Social History:  The patient  reports that he has never smoked. He has never used smokeless tobacco. He reports that he drinks alcohol. He reports that he does not use drugs.   Family History:  The patient's family history includes Diabetes in his maternal grandmother; GER disease in his father; Kidney disease in his mother.    ROS:  Please see the history of present illness.   All other systems are reviewed and negative.    PHYSICAL EXAM: VS:  BP 128/76   Pulse (!) 55   Ht 5\' 11"  (1.803 m)   Wt 235 lb 3.2 oz (106.7 kg)   BMI 32.80 kg/m  , BMI Body mass index is 32.8 kg/m. GEN: Well nourished, well  developed, in no acute distress  HEENT: normal  Neck: no JVD, carotid bruits, or masses Cardiac: irregular rhythm; no murmurs, rubs, or gallops,no edema  Respiratory:  clear to auscultation bilaterally, normal work of breathing GI: soft, nontender, nondistended, + BS MS: no deformity or atrophy  Skin: warm and dry Neuro:  Strength and sensation are intact Psych: euthymic mood, full affect  EKG:  EKG is ordered today. The ekg ordered today shows sinus rhythm, RBBB, LAFB, mobitz I block, rate 55  Recent Labs: 10/16/2016: B Natriuretic Peptide 39.7 10/28/2016: BUN 18; Creatinine, Ser 1.36; Hemoglobin 14.8; Platelets 501; Potassium 5.0; Sodium 134    Lipid Panel  No results found for: CHOL, TRIG, HDL, CHOLHDL, VLDL, LDLCALC, LDLDIRECT   Wt Readings from Last 3 Encounters:  12/05/16 235 lb 3.2 oz (106.7 kg)  10/16/16 230 lb (104.3 kg)  02/05/16 232 lb (105.2 kg)      Other studies Reviewed: Additional studies/ records that were reviewed today include: 48 hour monitor  Review of the above records today demonstrates:  Mean heart rate 67 bpm Maximum heart rate 109 bpm Minimum heart rate 48 bpm Ventricular beats 497 (0.32%) Supraventricular beats 542 (0.3%) Sinus rhythm with first degree AV block Intermittent Mobitz I block     TTE 09/07/15 EF 60-65%   ASSESSMENT AND PLAN:  1.  Trifascicular block: Unknown if supra vs infra-his block.  Patient is asymptomatic, but with significant conduction disease.I have discussed with him symptoms that would necessitate pacemaker placement including fatigue, dizziness, and syncope. He understands that if he has these and Camara Renstrom call us back in clinic. In review of the EKGs from the clinic, it does appear that he is continued to have sinus rhythm with Mobitz 1 AV block.  2. Pulmonary embolism: Continue rivaroxaban at current dose per primary physician  Current medicines are reviewed at length with the patient today.   The patient does not  have concerns regarding his medicines.  The following changes were made today:  none  Labs/ tests ordered today include:  Orders Placed This Encounter  Procedures  . EKG 12-Lead     Disposition:   FU with Huxley Vanwagoner 6 months  Signed, Wandalee Klang Meredith Leeds, MD  12/05/2016 11:11 AM     [facility] [address] [place] [address] [phone] (office) [phone] (fax)

## 2016-12-05 NOTE — Patient Instructions (Signed)

## 2016-12-09 DIAGNOSIS — I82409 Acute embolism and thrombosis of unspecified deep veins of unspecified lower extremity: Secondary | ICD-10-CM | POA: Diagnosis not present

## 2016-12-09 DIAGNOSIS — R143 Flatulence: Secondary | ICD-10-CM | POA: Diagnosis not present

## 2016-12-09 DIAGNOSIS — I4891 Unspecified atrial fibrillation: Secondary | ICD-10-CM | POA: Diagnosis not present

## 2016-12-09 DIAGNOSIS — D6851 Activated protein C resistance: Secondary | ICD-10-CM | POA: Diagnosis not present

## 2016-12-09 DIAGNOSIS — R079 Chest pain, unspecified: Secondary | ICD-10-CM | POA: Diagnosis not present

## 2016-12-09 DIAGNOSIS — E559 Vitamin D deficiency, unspecified: Secondary | ICD-10-CM | POA: Diagnosis not present

## 2016-12-09 DIAGNOSIS — F419 Anxiety disorder, unspecified: Secondary | ICD-10-CM | POA: Diagnosis not present

## 2016-12-11 DIAGNOSIS — R197 Diarrhea, unspecified: Secondary | ICD-10-CM | POA: Diagnosis not present

## 2016-12-11 DIAGNOSIS — R195 Other fecal abnormalities: Secondary | ICD-10-CM | POA: Diagnosis not present

## 2017-03-03 DIAGNOSIS — L570 Actinic keratosis: Secondary | ICD-10-CM | POA: Diagnosis not present

## 2017-03-03 DIAGNOSIS — L821 Other seborrheic keratosis: Secondary | ICD-10-CM | POA: Diagnosis not present

## 2017-03-03 DIAGNOSIS — I788 Other diseases of capillaries: Secondary | ICD-10-CM | POA: Diagnosis not present

## 2017-04-02 DIAGNOSIS — Z85828 Personal history of other malignant neoplasm of skin: Secondary | ICD-10-CM | POA: Diagnosis not present

## 2017-04-02 DIAGNOSIS — C44612 Basal cell carcinoma of skin of right upper limb, including shoulder: Secondary | ICD-10-CM | POA: Diagnosis not present

## 2017-04-02 DIAGNOSIS — L570 Actinic keratosis: Secondary | ICD-10-CM | POA: Diagnosis not present

## 2017-04-02 DIAGNOSIS — D485 Neoplasm of uncertain behavior of skin: Secondary | ICD-10-CM | POA: Diagnosis not present

## 2017-04-02 DIAGNOSIS — C44519 Basal cell carcinoma of skin of other part of trunk: Secondary | ICD-10-CM | POA: Diagnosis not present

## 2017-04-14 ENCOUNTER — Other Ambulatory Visit: Payer: Self-pay | Admitting: Internal Medicine

## 2017-04-14 DIAGNOSIS — M79604 Pain in right leg: Secondary | ICD-10-CM | POA: Diagnosis not present

## 2017-04-14 DIAGNOSIS — I82409 Acute embolism and thrombosis of unspecified deep veins of unspecified lower extremity: Secondary | ICD-10-CM | POA: Diagnosis not present

## 2017-04-14 DIAGNOSIS — D6851 Activated protein C resistance: Secondary | ICD-10-CM | POA: Diagnosis not present

## 2017-04-14 DIAGNOSIS — M79661 Pain in right lower leg: Secondary | ICD-10-CM

## 2017-04-15 ENCOUNTER — Other Ambulatory Visit: Payer: Medicare Other

## 2017-06-02 DIAGNOSIS — F419 Anxiety disorder, unspecified: Secondary | ICD-10-CM | POA: Diagnosis not present

## 2017-06-02 DIAGNOSIS — Z79899 Other long term (current) drug therapy: Secondary | ICD-10-CM | POA: Diagnosis not present

## 2017-06-02 DIAGNOSIS — R195 Other fecal abnormalities: Secondary | ICD-10-CM | POA: Diagnosis not present

## 2017-06-02 DIAGNOSIS — Z0000 Encounter for general adult medical examination without abnormal findings: Secondary | ICD-10-CM | POA: Diagnosis not present

## 2017-06-02 DIAGNOSIS — D6851 Activated protein C resistance: Secondary | ICD-10-CM | POA: Diagnosis not present

## 2017-06-02 DIAGNOSIS — Z0001 Encounter for general adult medical examination with abnormal findings: Secondary | ICD-10-CM | POA: Diagnosis not present

## 2017-06-02 DIAGNOSIS — E039 Hypothyroidism, unspecified: Secondary | ICD-10-CM | POA: Diagnosis not present

## 2017-06-02 DIAGNOSIS — Z1389 Encounter for screening for other disorder: Secondary | ICD-10-CM | POA: Diagnosis not present

## 2017-06-02 DIAGNOSIS — E559 Vitamin D deficiency, unspecified: Secondary | ICD-10-CM | POA: Diagnosis not present

## 2017-06-02 DIAGNOSIS — I4891 Unspecified atrial fibrillation: Secondary | ICD-10-CM | POA: Diagnosis not present

## 2017-06-02 DIAGNOSIS — I82409 Acute embolism and thrombosis of unspecified deep veins of unspecified lower extremity: Secondary | ICD-10-CM | POA: Diagnosis not present

## 2017-06-02 DIAGNOSIS — Z125 Encounter for screening for malignant neoplasm of prostate: Secondary | ICD-10-CM | POA: Diagnosis not present

## 2017-06-11 IMAGING — CR DG CHEST 2V
2 series · 2 of 2 positions shown · non-contrast
Comparison: 10/16/2016

CLINICAL DATA: Increased shortness of breath and left chest pain,
recent acute pulmonary emboli 10/16/2016

EXAM:
CHEST  2 VIEW

[chest pa]
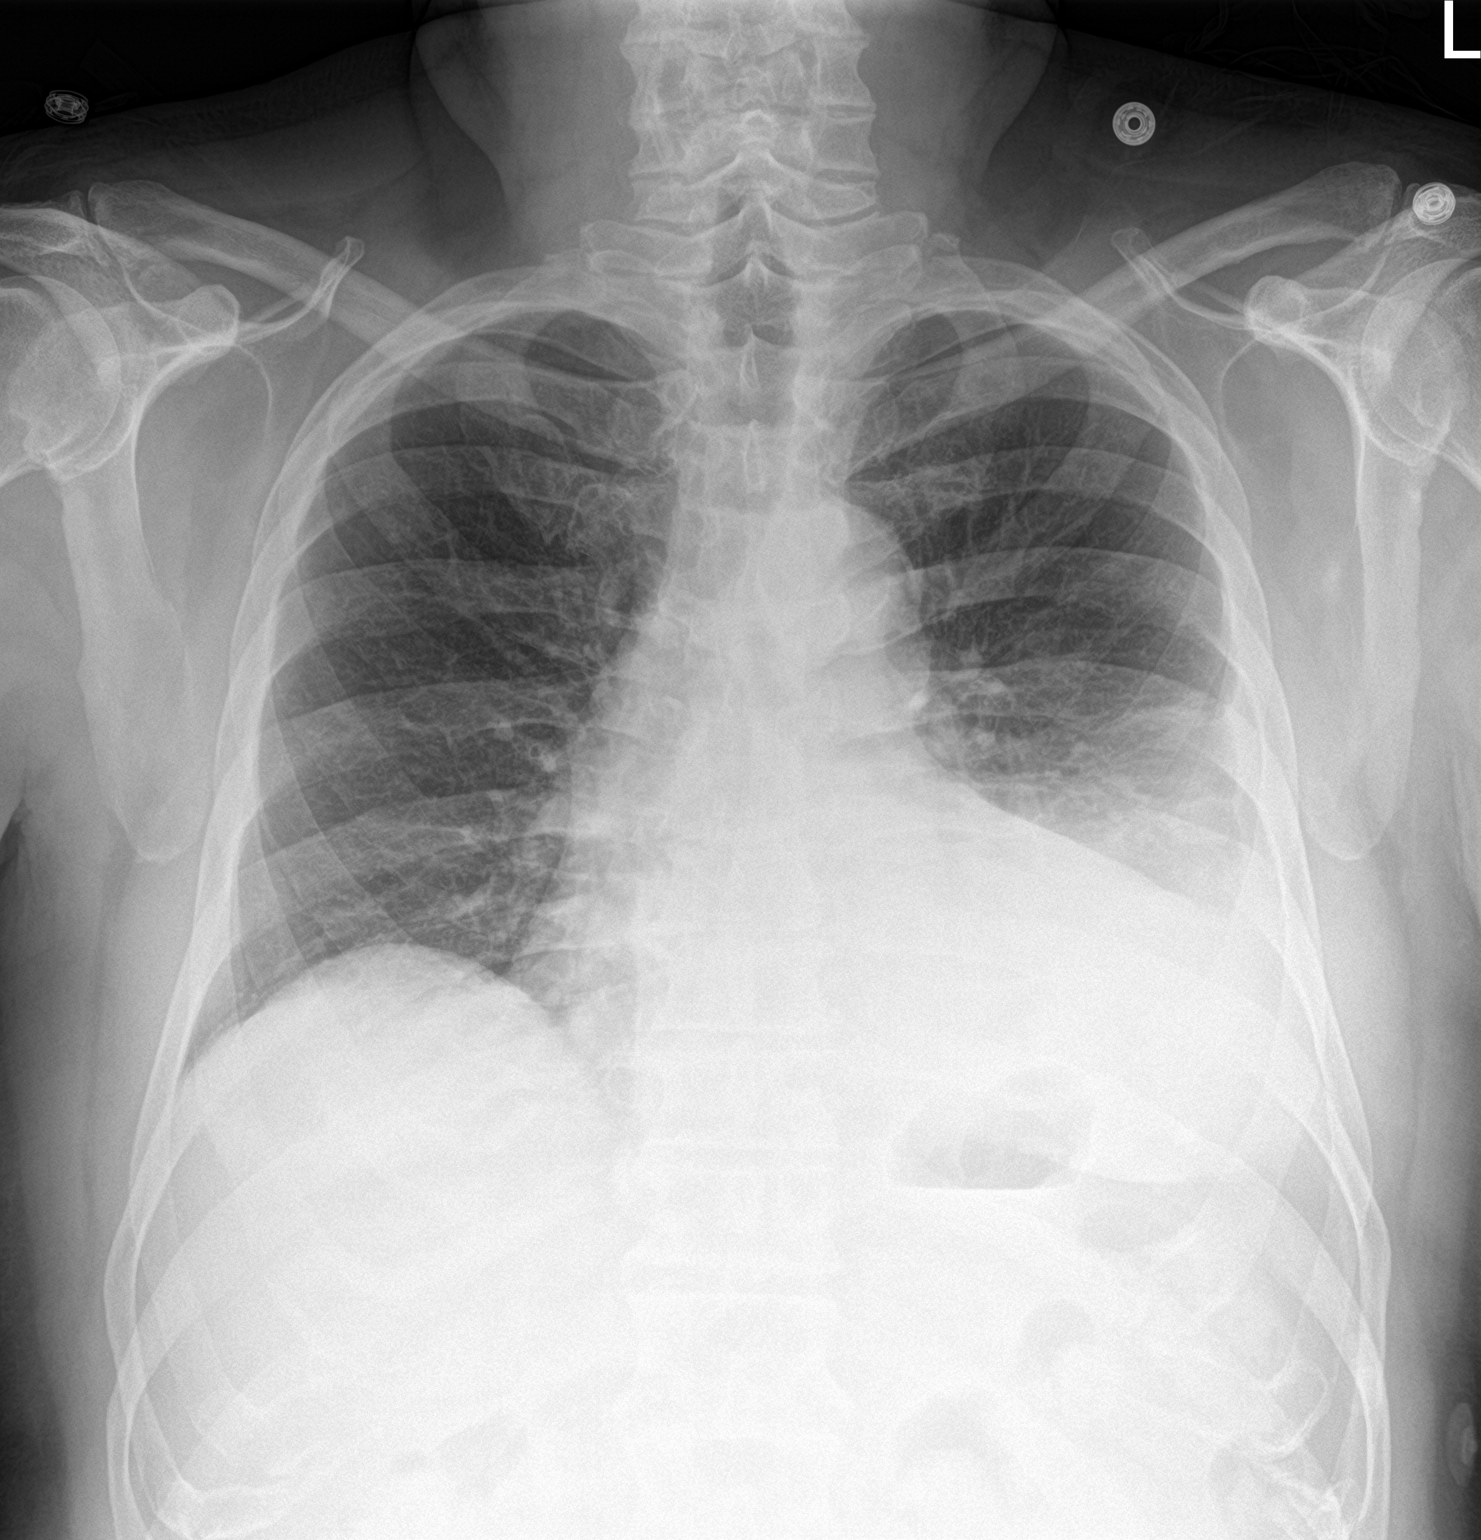

[chest lat]
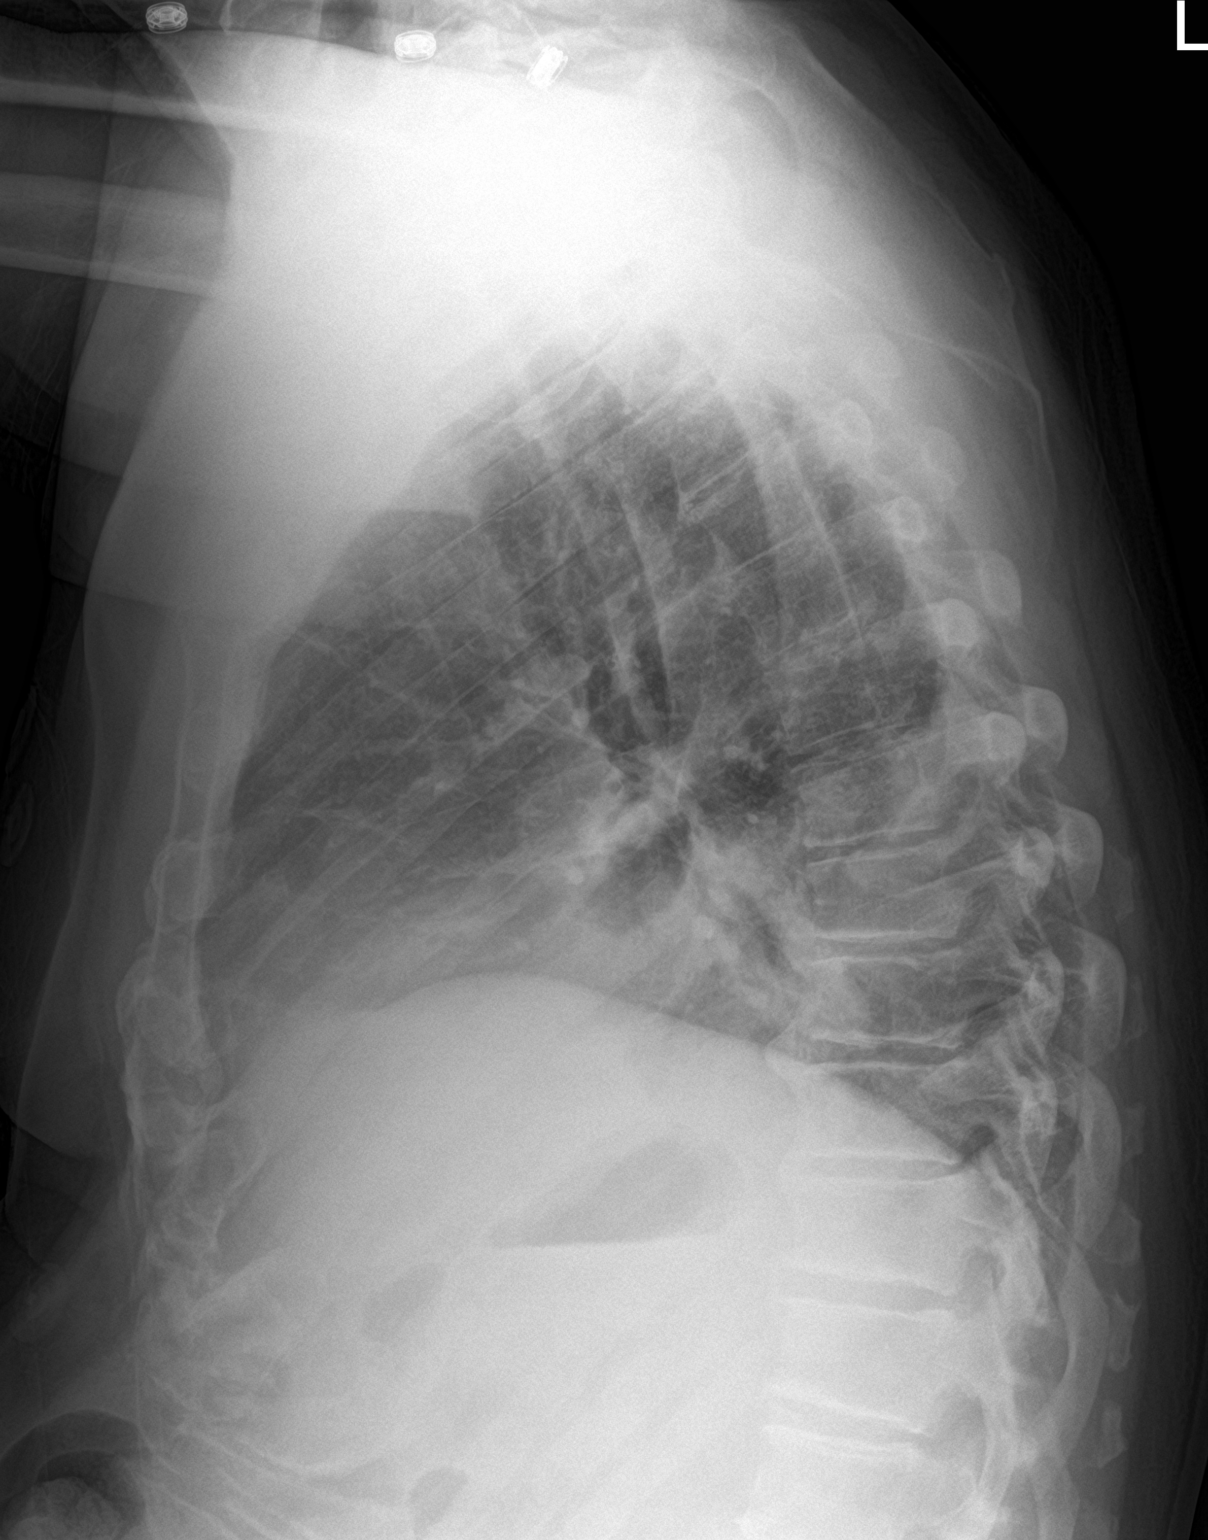

[2 of 2 positions shown; findings below may reference images not displayed]

FINDINGS: Increased left lower lobe dense consolidation and associated left
effusion. This is in the region of recent left lower lobe acute
pulmonary emboli by CT 10/16/2016. Therefore, the consolidation may
represent a combination of pulmonary infarct/pneumonia and partial
collapse. Right lung remains clear. No pneumothorax. Trachea is
midline. Atherosclerosis noted of the aorta. Degenerative change
present of the spine.
IMPRESSION: Increased left lower lobe dense consolidation and small left
effusion, compatible with a combination of pulmonary
infarct/pneumonia and partial collapse.

Associated small left effusion.

Thoracic aortic atherosclerosis

## 2017-06-27 IMAGING — CR DG CHEST 2V
2 series · 2 of 2 positions shown · non-contrast
Comparison: 10/28/2016

CLINICAL DATA: Cough, congestion, shortness of breath

EXAM:
CHEST  2 VIEW

[w chest pa]
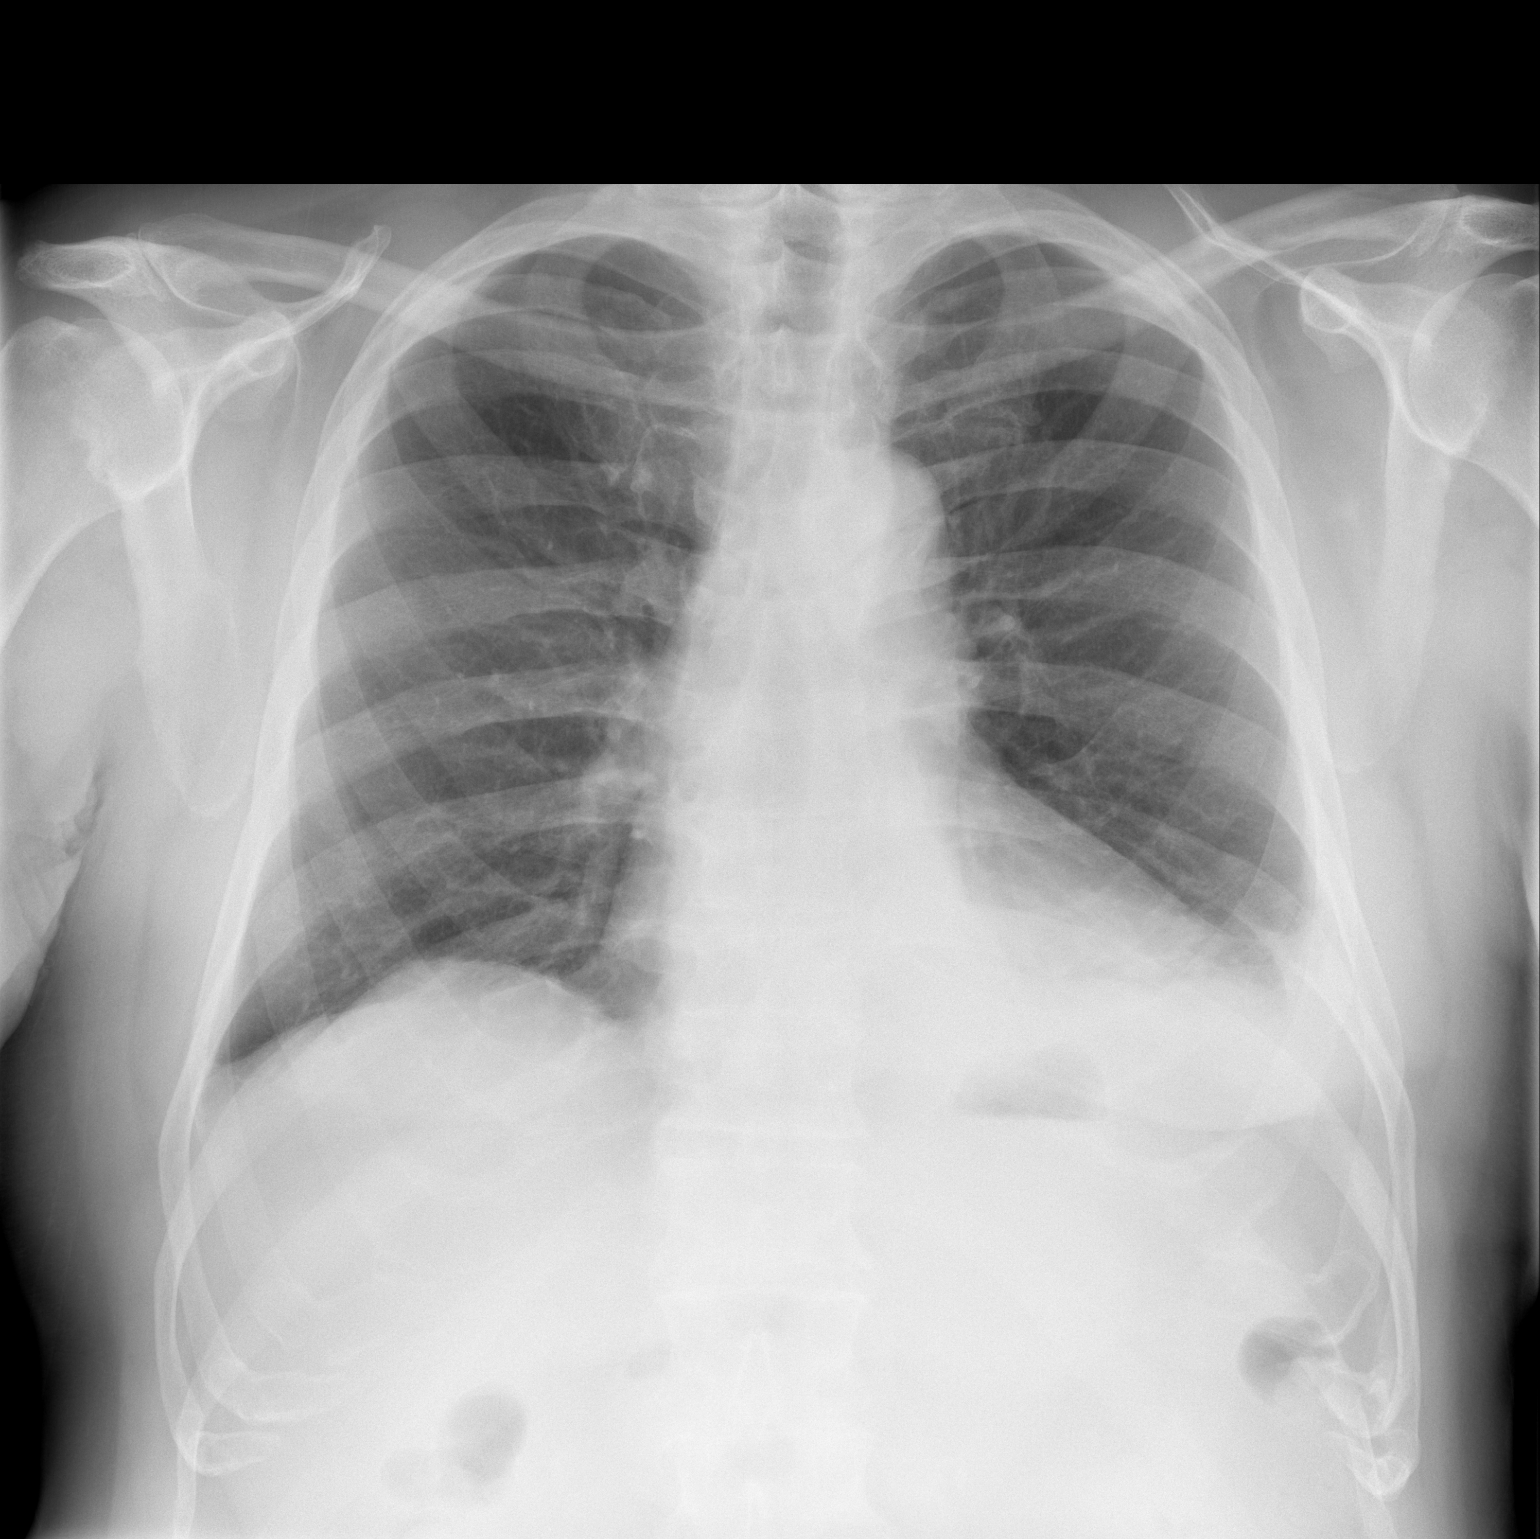

[w chest lat]
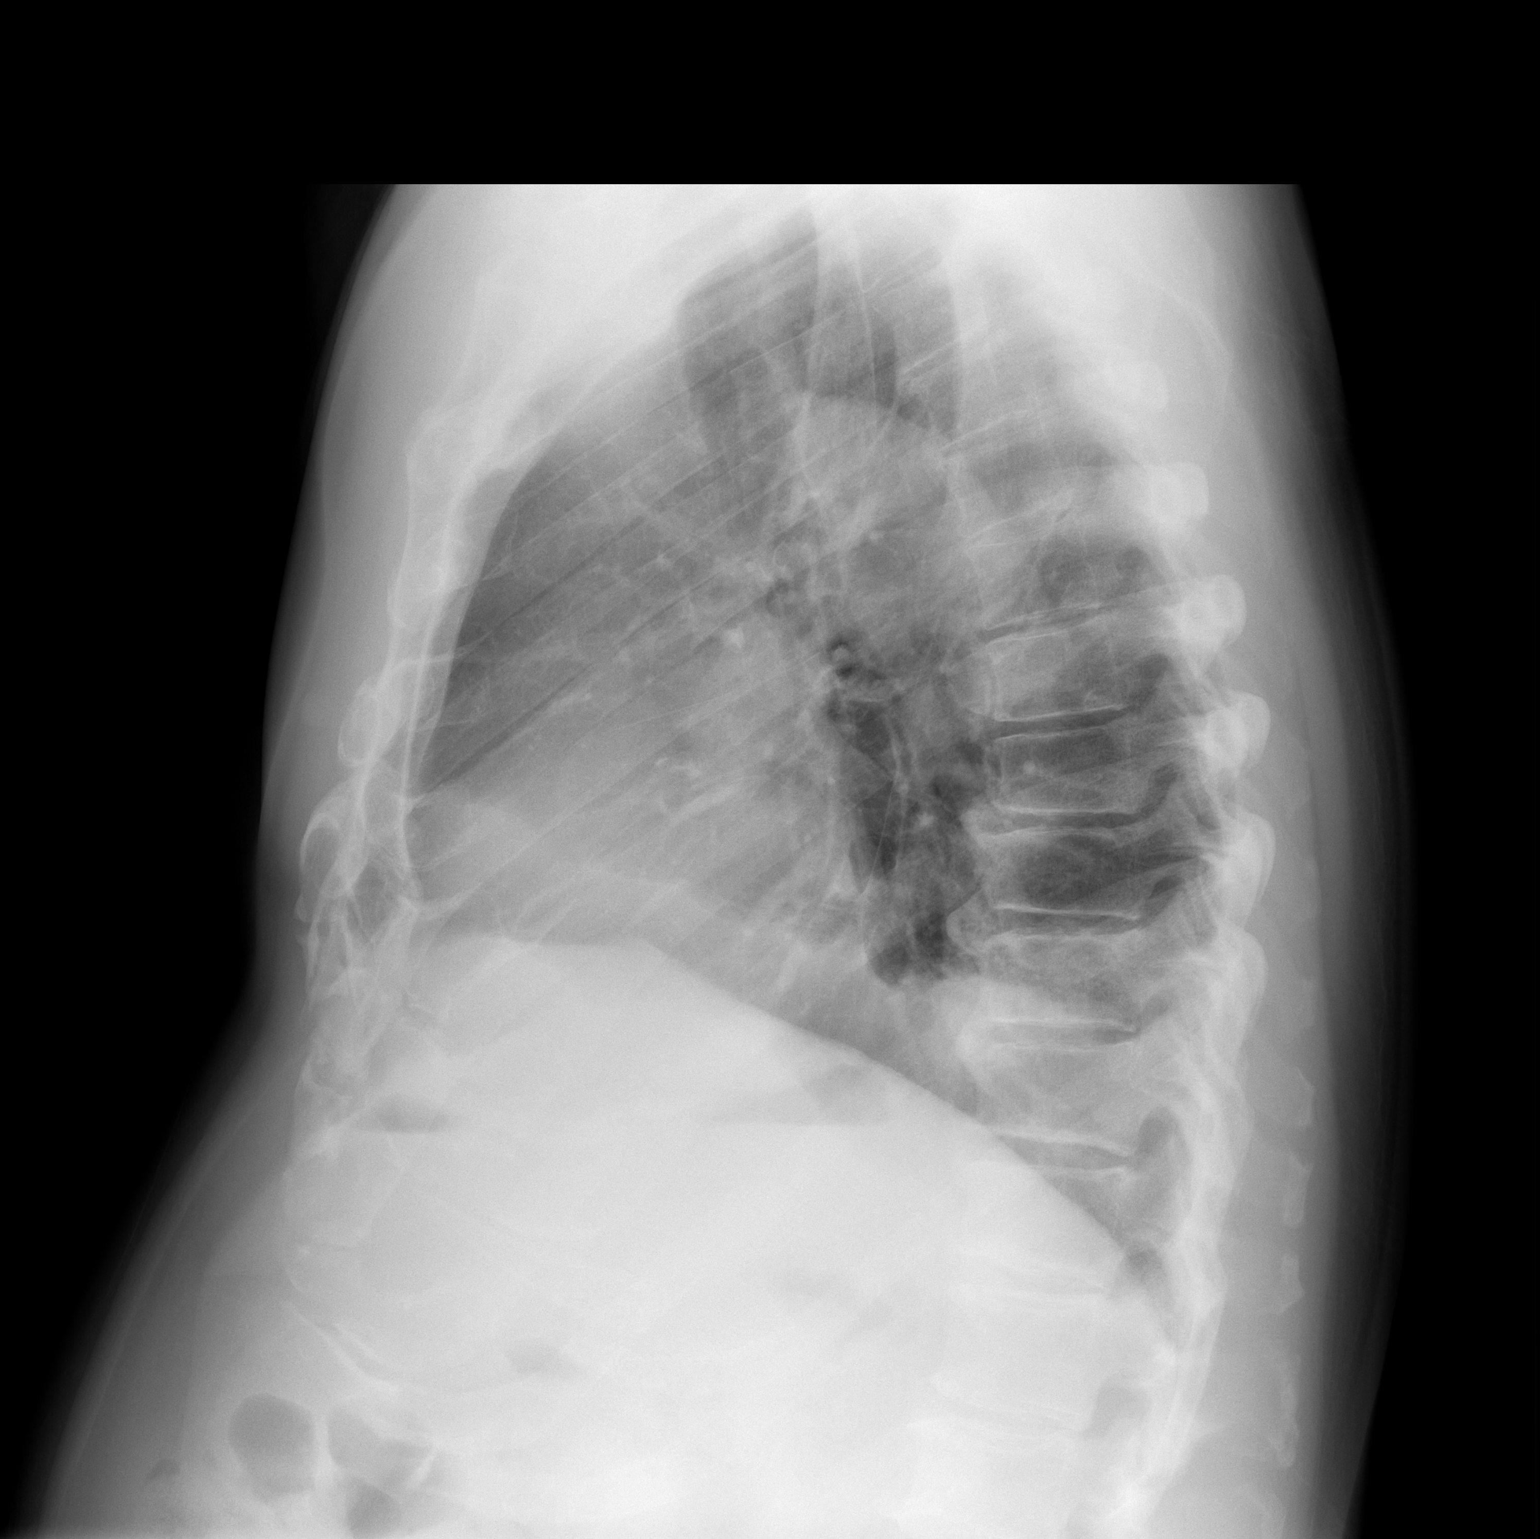

[2 of 2 positions shown; findings below may reference images not displayed]

FINDINGS: Enlargement of cardiac silhouette.

Mediastinal contours and pulmonary vascularity normal.

Atherosclerotic calcification aorta.

LEFT pleural effusion and basilar atelectasis, improved since the
previous exam.

RIGHT lung remains clear.

No pneumothorax.

Scattered endplate spurs thoracic spine.
IMPRESSION: Decreased atelectasis and effusion at LEFT lung base since prior
study.

## 2017-07-31 DIAGNOSIS — Z85828 Personal history of other malignant neoplasm of skin: Secondary | ICD-10-CM | POA: Diagnosis not present

## 2017-07-31 DIAGNOSIS — L570 Actinic keratosis: Secondary | ICD-10-CM | POA: Diagnosis not present

## 2017-07-31 DIAGNOSIS — L821 Other seborrheic keratosis: Secondary | ICD-10-CM | POA: Diagnosis not present

## 2018-01-29 DIAGNOSIS — L910 Hypertrophic scar: Secondary | ICD-10-CM | POA: Diagnosis not present

## 2018-01-29 DIAGNOSIS — L821 Other seborrheic keratosis: Secondary | ICD-10-CM | POA: Diagnosis not present

## 2018-01-29 DIAGNOSIS — L820 Inflamed seborrheic keratosis: Secondary | ICD-10-CM | POA: Diagnosis not present

## 2018-01-29 DIAGNOSIS — Z85828 Personal history of other malignant neoplasm of skin: Secondary | ICD-10-CM | POA: Diagnosis not present

## 2018-01-29 DIAGNOSIS — Z79899 Other long term (current) drug therapy: Secondary | ICD-10-CM | POA: Diagnosis not present

## 2018-01-29 DIAGNOSIS — L570 Actinic keratosis: Secondary | ICD-10-CM | POA: Diagnosis not present

## 2018-01-29 DIAGNOSIS — L812 Freckles: Secondary | ICD-10-CM | POA: Diagnosis not present

## 2018-01-29 DIAGNOSIS — B351 Tinea unguium: Secondary | ICD-10-CM | POA: Diagnosis not present

## 2018-01-29 DIAGNOSIS — L603 Nail dystrophy: Secondary | ICD-10-CM | POA: Diagnosis not present

## 2018-02-22 DIAGNOSIS — R140 Abdominal distension (gaseous): Secondary | ICD-10-CM | POA: Diagnosis not present

## 2018-03-02 DIAGNOSIS — M2550 Pain in unspecified joint: Secondary | ICD-10-CM | POA: Diagnosis not present

## 2018-03-12 DIAGNOSIS — M79673 Pain in unspecified foot: Secondary | ICD-10-CM | POA: Diagnosis not present

## 2018-03-12 DIAGNOSIS — M79642 Pain in left hand: Secondary | ICD-10-CM | POA: Diagnosis not present

## 2018-03-12 DIAGNOSIS — M19041 Primary osteoarthritis, right hand: Secondary | ICD-10-CM | POA: Diagnosis not present

## 2018-03-12 DIAGNOSIS — M79643 Pain in unspecified hand: Secondary | ICD-10-CM | POA: Diagnosis not present

## 2018-03-12 DIAGNOSIS — M25559 Pain in unspecified hip: Secondary | ICD-10-CM | POA: Diagnosis not present

## 2018-03-12 DIAGNOSIS — M19072 Primary osteoarthritis, left ankle and foot: Secondary | ICD-10-CM | POA: Diagnosis not present

## 2018-03-12 DIAGNOSIS — M2550 Pain in unspecified joint: Secondary | ICD-10-CM | POA: Diagnosis not present

## 2018-03-12 DIAGNOSIS — M19042 Primary osteoarthritis, left hand: Secondary | ICD-10-CM | POA: Diagnosis not present

## 2018-03-12 DIAGNOSIS — M25539 Pain in unspecified wrist: Secondary | ICD-10-CM | POA: Diagnosis not present

## 2018-03-12 DIAGNOSIS — M79641 Pain in right hand: Secondary | ICD-10-CM | POA: Diagnosis not present

## 2018-03-12 DIAGNOSIS — M19071 Primary osteoarthritis, right ankle and foot: Secondary | ICD-10-CM | POA: Diagnosis not present

## 2018-03-12 DIAGNOSIS — I00 Rheumatic fever without heart involvement: Secondary | ICD-10-CM | POA: Diagnosis not present

## 2018-03-12 DIAGNOSIS — M7989 Other specified soft tissue disorders: Secondary | ICD-10-CM | POA: Diagnosis not present

## 2018-03-15 DIAGNOSIS — M79673 Pain in unspecified foot: Secondary | ICD-10-CM | POA: Diagnosis not present

## 2018-03-15 DIAGNOSIS — M25559 Pain in unspecified hip: Secondary | ICD-10-CM | POA: Diagnosis not present

## 2018-03-15 DIAGNOSIS — M7989 Other specified soft tissue disorders: Secondary | ICD-10-CM | POA: Diagnosis not present

## 2018-03-15 DIAGNOSIS — M2550 Pain in unspecified joint: Secondary | ICD-10-CM | POA: Diagnosis not present

## 2018-03-16 ENCOUNTER — Ambulatory Visit: Payer: Medicare Other | Admitting: Internal Medicine

## 2018-03-18 ENCOUNTER — Ambulatory Visit (INDEPENDENT_AMBULATORY_CARE_PROVIDER_SITE_OTHER): Payer: Medicare Other | Admitting: Internal Medicine

## 2018-03-18 ENCOUNTER — Encounter: Payer: Self-pay | Admitting: Internal Medicine

## 2018-03-18 DIAGNOSIS — R140 Abdominal distension (gaseous): Secondary | ICD-10-CM | POA: Diagnosis not present

## 2018-03-18 NOTE — Progress Notes (Signed)
[facility]      Reason for Consult: diarrhea and abdominal bloating and concern for parasites    Referring Physician: Dr. Inda Merlin    Patient ID: Terrence Beard, male    DOB: 1941-04-30, 77 y.o.   MRN: [id number]  HPI:   Here for evaluation of above.  He first noted diarrhea he feels started suddenly about 4 years ago in May.  He had persistent symptoms over the years though some waxing and waning and was hesitant to come here now as symptoms were resolving.  It has mainly been diarrhea but not associated with non-purposful weight loss.  He had O and P by Dr. Inda Merlin in 2017 and no positive findings.  He has felt his issues are a result of parasites due to his reading on the internet.  He has noted some fatigue associated with this as well.  In the past, he has tried over the counter antiparasitic treatments and felt they were helping.  He had a colonoscopy as well and he mentioned his concern about parasites then but the GI doctor did not report any concerns on colonoscopy.  He felt initially he had pinworms and felt grittiness there and does wash regularly.  He saw rheumoatology recently as well and had an extensive blood panel done, results not available.  No fever during this time.  He also initially had a rash on his torso.  Recently he has had bilateral hand swelling, R>L.  He has not traveled outside of [place] prior to this or during the last 4 years.  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  He has been evaluating his stool and did not long string-like findings recently and felt better after passing them.   Previous record reviewed Dr. Inda Merlin, has had previous O and P tests, no significant eosinophilia, no anemia.    Past Medical History:  Diagnosis Date  . Blood clot associated with vein wall inflammation    R. calf   . CKD (chronic kidney disease) stage 3, GFR 30-59 ml/min (HCC)    not treated  . DJD (degenerative joint  disease)   . Dysphagia   . Erectile dysfunction   . GERD (gastroesophageal reflux disease)   . Glucose intolerance (impaired glucose tolerance)   . History of echocardiogram    a. Echo 7/14:  EF 60-65%, mod asymmetric LVH, Mild LAE, mild to mod MR,   . History of nuclear stress test    a. ETT-Myoview 7/14:  EF 71%, normal perfusion, low risk  . Hypothyroidism   . Multiple lipomas   . Nasal polyposis   . Obesity   . RBBB   . Seasonal allergies     Prior to Admission medications   Medication Sig Start Date End Date Taking? Authorizing Provider  Cholecalciferol (VITAMIN D) 2000 UNITS tablet Take 2,000 Units by mouth 2 (two) times a week.    Yes [provider]  levothyroxine (SYNTHROID, LEVOTHROID) 50 MCG tablet Take 50 mcg by mouth daily before breakfast.   Yes [provider]  vitamin E 100 UNIT capsule Take 100 Units by mouth once a week.   Yes [provider]  calcium carbonate (TUMS - DOSED IN MG ELEMENTAL CALCIUM) 500 MG chewable tablet Chew 2-3 tablets by mouth at bedtime as needed for indigestion or heartburn.    [provider]  Rivaroxaban 15 & 20 MG TBPK Take as directed on package: Start with one  15mg  tablet by mouth twice a day with food. On Day 22, switch to one 20mg  tablet once a day with food. Patient not taking: Reported on 03/18/2018 10/28/16   Virgel Manifold, MD  sildenafil (VIAGRA) 100 MG tablet Take 100 mg by mouth daily as needed for erectile dysfunction.    [provider]    Allergies  Allergen Reactions  . Latex     Social History   Tobacco Use  . Smoking status: Never Smoker  . Smokeless tobacco: Never Used  Substance Use Topics  . Alcohol use: Yes    Comment: twice/mo  . Drug use: No    Family History  Problem Relation Age of Onset  . GER disease Father        died in his 27s  . Kidney disease Mother   . Diabetes Maternal Grandmother   . Heart attack Neg Hx     Review of Systems  Constitutional:  positive for fatigue and malaise or negative for anorexia and weight loss Ears, nose, mouth, throat, and face: negative for ear drainage and sore throat Respiratory: negative for cough or sputum Gastrointestinal: positive for diarrhea, negative for nausea and vomiting Hematologic/lymphatic: negative for lymphadenopathy Musculoskeletal: positive for arthralgias and stiff joints, negative for myalgias All other systems reviewed and are negative    Constitutional: in no apparent distress  Vitals:   03/18/18 0907  Weight: 234 lb (106.1 kg)  Height: 5\' 10"  (1.778 m)   EYES: anicteric ENMT: no thrush Cardiovascular: Cor Brady Respiratory: CTA B; normal respiratory effort GI: Bowel sounds are normal, liver is not enlarged, spleen is not enlarged Musculoskeletal: no pedal edema noted, bilateral hands with non-pitting edema, no joint swelling Skin: negatives: no rash Hematologic: no cervical lad  Labs: Lab Results  Component Value Date   WBC 12.8 (H) 10/28/2016   HGB 14.8 10/28/2016   HCT 44.0 10/28/2016   MCV 89.1 10/28/2016   PLT 501 (H) 10/28/2016    Lab Results  Component Value Date   CREATININE 1.36 (H) 10/28/2016   BUN 18 10/28/2016   NA 134 (L) 10/28/2016   K 5.0 10/28/2016   CL 102 10/28/2016   CO2 25 10/28/2016   No results found for: ALT, AST, GGT, ALKPHOS, BILITOT, INR   Assessment: Abdominal bloating of unknown etiology. I discussed with him that his symptoms are not only associated with parasites but occur without parasites and that having a parasite in the [place] without any foreign travel is unlikely.  Differential diagnosis includes medication, GI disease, stress/anxiety. I don't suspect infectious/parasite in origin and I discussed this with him.  I will however do a repeat of the O and P x 3 and test for strongyloides Ab which is found in the [place].  He did appear though to continue to fixate on the etiology being parasites.  I will not test the GI pathogen  panel since there are often false positive findings in those with a low pretest probablility.    Plan: 1) O and P x 3 2) Strongyloides Ab  Rtc if there are any positive results, otherwise as needed. Thanks for the consultation.

## 2018-03-22 LAB — STRONGYLOIDES ANTIBODY: STRONGYLOIDES IGG ANTIBODY, ELISA: NEGATIVE

## 2018-04-02 DIAGNOSIS — M2550 Pain in unspecified joint: Secondary | ICD-10-CM | POA: Diagnosis not present

## 2018-04-02 DIAGNOSIS — I00 Rheumatic fever without heart involvement: Secondary | ICD-10-CM | POA: Diagnosis not present

## 2018-04-02 DIAGNOSIS — M7989 Other specified soft tissue disorders: Secondary | ICD-10-CM | POA: Diagnosis not present

## 2018-04-02 DIAGNOSIS — M25539 Pain in unspecified wrist: Secondary | ICD-10-CM | POA: Diagnosis not present

## 2018-04-02 DIAGNOSIS — M25559 Pain in unspecified hip: Secondary | ICD-10-CM | POA: Diagnosis not present

## 2018-04-02 DIAGNOSIS — H01005 Unspecified blepharitis left lower eyelid: Secondary | ICD-10-CM | POA: Diagnosis not present

## 2018-04-02 DIAGNOSIS — M79643 Pain in unspecified hand: Secondary | ICD-10-CM | POA: Diagnosis not present

## 2018-04-02 DIAGNOSIS — H01002 Unspecified blepharitis right lower eyelid: Secondary | ICD-10-CM | POA: Diagnosis not present

## 2018-04-02 DIAGNOSIS — H01001 Unspecified blepharitis right upper eyelid: Secondary | ICD-10-CM | POA: Diagnosis not present

## 2018-04-02 DIAGNOSIS — M79673 Pain in unspecified foot: Secondary | ICD-10-CM | POA: Diagnosis not present

## 2018-04-02 DIAGNOSIS — H01004 Unspecified blepharitis left upper eyelid: Secondary | ICD-10-CM | POA: Diagnosis not present

## 2018-06-22 DIAGNOSIS — Z0000 Encounter for general adult medical examination without abnormal findings: Secondary | ICD-10-CM | POA: Diagnosis not present

## 2018-06-22 DIAGNOSIS — E669 Obesity, unspecified: Secondary | ICD-10-CM | POA: Diagnosis not present

## 2018-06-22 DIAGNOSIS — Z1389 Encounter for screening for other disorder: Secondary | ICD-10-CM | POA: Diagnosis not present

## 2018-06-22 DIAGNOSIS — Z125 Encounter for screening for malignant neoplasm of prostate: Secondary | ICD-10-CM | POA: Diagnosis not present

## 2018-06-22 DIAGNOSIS — R140 Abdominal distension (gaseous): Secondary | ICD-10-CM | POA: Diagnosis not present

## 2018-06-22 DIAGNOSIS — M2550 Pain in unspecified joint: Secondary | ICD-10-CM | POA: Diagnosis not present

## 2018-06-22 DIAGNOSIS — I340 Nonrheumatic mitral (valve) insufficiency: Secondary | ICD-10-CM | POA: Diagnosis not present

## 2018-06-22 DIAGNOSIS — I82409 Acute embolism and thrombosis of unspecified deep veins of unspecified lower extremity: Secondary | ICD-10-CM | POA: Diagnosis not present

## 2018-06-22 DIAGNOSIS — N529 Male erectile dysfunction, unspecified: Secondary | ICD-10-CM | POA: Diagnosis not present

## 2018-06-22 DIAGNOSIS — D6851 Activated protein C resistance: Secondary | ICD-10-CM | POA: Diagnosis not present

## 2018-06-22 DIAGNOSIS — E559 Vitamin D deficiency, unspecified: Secondary | ICD-10-CM | POA: Diagnosis not present

## 2018-06-22 DIAGNOSIS — Z23 Encounter for immunization: Secondary | ICD-10-CM | POA: Diagnosis not present

## 2018-06-22 DIAGNOSIS — I4891 Unspecified atrial fibrillation: Secondary | ICD-10-CM | POA: Diagnosis not present

## 2018-06-22 DIAGNOSIS — Z7189 Other specified counseling: Secondary | ICD-10-CM | POA: Diagnosis not present

## 2018-06-22 DIAGNOSIS — N183 Chronic kidney disease, stage 3 (moderate): Secondary | ICD-10-CM | POA: Diagnosis not present

## 2018-06-22 DIAGNOSIS — R739 Hyperglycemia, unspecified: Secondary | ICD-10-CM | POA: Diagnosis not present

## 2018-07-20 ENCOUNTER — Other Ambulatory Visit: Payer: Medicare Other

## 2018-07-20 DIAGNOSIS — R140 Abdominal distension (gaseous): Secondary | ICD-10-CM

## 2018-07-30 ENCOUNTER — Other Ambulatory Visit: Payer: Medicare Other

## 2018-07-30 DIAGNOSIS — R140 Abdominal distension (gaseous): Secondary | ICD-10-CM

## 2018-08-04 LAB — OVA AND PARASITE EXAMINATION
CONCENTRATE RESULT:: NONE SEEN
CONCENTRATE RESULT:: NONE SEEN
MICRO NUMBER:: 91165781
MICRO NUMBER:: 91165657
SPECIMEN QUALITY: ADEQUATE
SPECIMEN QUALITY: ADEQUATE
TRICHROME RESULT: NONE SEEN
TRICHROME RESULT:: NONE SEEN

## 2018-08-05 ENCOUNTER — Telehealth: Payer: Self-pay | Admitting: *Deleted

## 2018-08-05 NOTE — Telephone Encounter (Signed)
Per Comer called the patient to advise him that his recent stool was negative and no follow up is needed. Patient advised he understands and will call is if needed in the future.

## 2018-08-05 NOTE — Telephone Encounter (Signed)
-----   Message from Thayer Headings, MD sent at 08/05/2018 12:09 PM EDT ----- Please let him know his recent stool studies are negative for parasites so no treatment or follow up indicated.   thanks

## 2018-11-03 ENCOUNTER — Emergency Department (HOSPITAL_COMMUNITY): Payer: Medicare Other

## 2018-11-03 ENCOUNTER — Encounter (HOSPITAL_COMMUNITY): Payer: Self-pay | Admitting: *Deleted

## 2018-11-03 ENCOUNTER — Other Ambulatory Visit: Payer: Self-pay

## 2018-11-03 ENCOUNTER — Inpatient Hospital Stay (HOSPITAL_COMMUNITY)
Admission: EM | Admit: 2018-11-03 | Discharge: 2018-11-05 | DRG: 194 | Disposition: A | Discharge status: Home / Self Care | Payer: Medicare Other | Attending: Internal Medicine | Admitting: Internal Medicine

## 2018-11-03 DIAGNOSIS — N183 Chronic kidney disease, stage 3 (moderate): Secondary | ICD-10-CM | POA: Diagnosis present

## 2018-11-03 DIAGNOSIS — J181 Lobar pneumonia, unspecified organism: Secondary | ICD-10-CM

## 2018-11-03 DIAGNOSIS — R0789 Other chest pain: Secondary | ICD-10-CM | POA: Diagnosis not present

## 2018-11-03 DIAGNOSIS — J189 Pneumonia, unspecified organism: Secondary | ICD-10-CM | POA: Diagnosis not present

## 2018-11-03 DIAGNOSIS — I5032 Chronic diastolic (congestive) heart failure: Secondary | ICD-10-CM | POA: Diagnosis present

## 2018-11-03 DIAGNOSIS — R7303 Prediabetes: Secondary | ICD-10-CM | POA: Diagnosis present

## 2018-11-03 DIAGNOSIS — Z91040 Latex allergy status: Secondary | ICD-10-CM | POA: Diagnosis not present

## 2018-11-03 DIAGNOSIS — E871 Hypo-osmolality and hyponatremia: Secondary | ICD-10-CM | POA: Diagnosis present

## 2018-11-03 DIAGNOSIS — Z7901 Long term (current) use of anticoagulants: Secondary | ICD-10-CM | POA: Diagnosis not present

## 2018-11-03 DIAGNOSIS — Z86711 Personal history of pulmonary embolism: Secondary | ICD-10-CM

## 2018-11-03 DIAGNOSIS — R739 Hyperglycemia, unspecified: Secondary | ICD-10-CM | POA: Diagnosis present

## 2018-11-03 DIAGNOSIS — I130 Hypertensive heart and chronic kidney disease with heart failure and stage 1 through stage 4 chronic kidney disease, or unspecified chronic kidney disease: Secondary | ICD-10-CM | POA: Diagnosis present

## 2018-11-03 DIAGNOSIS — Z86718 Personal history of other venous thrombosis and embolism: Secondary | ICD-10-CM

## 2018-11-03 DIAGNOSIS — D72829 Elevated white blood cell count, unspecified: Secondary | ICD-10-CM | POA: Diagnosis not present

## 2018-11-03 DIAGNOSIS — K219 Gastro-esophageal reflux disease without esophagitis: Secondary | ICD-10-CM | POA: Diagnosis present

## 2018-11-03 DIAGNOSIS — I5030 Unspecified diastolic (congestive) heart failure: Secondary | ICD-10-CM | POA: Diagnosis not present

## 2018-11-03 DIAGNOSIS — I4892 Unspecified atrial flutter: Secondary | ICD-10-CM | POA: Diagnosis present

## 2018-11-03 DIAGNOSIS — E039 Hypothyroidism, unspecified: Secondary | ICD-10-CM | POA: Diagnosis not present

## 2018-11-03 DIAGNOSIS — Z79899 Other long term (current) drug therapy: Secondary | ICD-10-CM | POA: Diagnosis not present

## 2018-11-03 DIAGNOSIS — R0600 Dyspnea, unspecified: Secondary | ICD-10-CM

## 2018-11-03 DIAGNOSIS — I440 Atrioventricular block, first degree: Secondary | ICD-10-CM | POA: Diagnosis present

## 2018-11-03 DIAGNOSIS — I453 Trifascicular block: Secondary | ICD-10-CM | POA: Diagnosis present

## 2018-11-03 DIAGNOSIS — R0602 Shortness of breath: Secondary | ICD-10-CM | POA: Diagnosis not present

## 2018-11-03 DIAGNOSIS — N189 Chronic kidney disease, unspecified: Secondary | ICD-10-CM | POA: Diagnosis present

## 2018-11-03 DIAGNOSIS — I483 Typical atrial flutter: Secondary | ICD-10-CM | POA: Diagnosis not present

## 2018-11-03 DIAGNOSIS — I4510 Unspecified right bundle-branch block: Secondary | ICD-10-CM | POA: Diagnosis present

## 2018-11-03 LAB — COMPREHENSIVE METABOLIC PANEL
ALT: 52 U/L — ABNORMAL HIGH (ref 0–44)
AST: 28 U/L (ref 15–41)
Albumin: 3.1 g/dL — ABNORMAL LOW (ref 3.5–5.0)
Alkaline Phosphatase: 65 U/L (ref 38–126)
Anion gap: 11 (ref 5–15)
BUN: 21 mg/dL (ref 8–23)
CHLORIDE: 101 mmol/L (ref 98–111)
CO2: 20 mmol/L — ABNORMAL LOW (ref 22–32)
Calcium: 9.5 mg/dL (ref 8.9–10.3)
Creatinine, Ser: 1.44 mg/dL — ABNORMAL HIGH (ref 0.61–1.24)
GFR calc Af Amer: 54 mL/min — ABNORMAL LOW (ref >60)
GFR, EST NON AFRICAN AMERICAN: 47 mL/min — AB (ref >60)
Glucose, Bld: 287 mg/dL — ABNORMAL HIGH (ref 70–99)
Potassium: 4.3 mmol/L (ref 3.5–5.1)
Sodium: 132 mmol/L — ABNORMAL LOW (ref 135–145)
Total Bilirubin: 0.6 mg/dL (ref 0.3–1.2)
Total Protein: 6.8 g/dL (ref 6.5–8.1)

## 2018-11-03 LAB — I-STAT TROPONIN, ED
Troponin i, poc: 0.02 ng/mL (ref 0.00–0.08)
Troponin i, poc: 0.01 ng/mL (ref 0.00–0.08)

## 2018-11-03 LAB — GLUCOSE, CAPILLARY: GLUCOSE-CAPILLARY: 146 mg/dL — AB (ref 70–99)

## 2018-11-03 LAB — CBC
HCT: 43.4 % (ref 39.0–52.0)
Hemoglobin: 14.2 g/dL (ref 13.0–17.0)
MCH: 29.8 pg (ref 26.0–34.0)
MCHC: 32.7 g/dL (ref 30.0–36.0)
MCV: 91 fL (ref 80.0–100.0)
Platelets: 444 10*3/uL — ABNORMAL HIGH (ref 150–400)
RBC: 4.77 MIL/uL (ref 4.22–5.81)
RDW: 14 % (ref 11.5–15.5)
WBC: 15.5 10*3/uL — ABNORMAL HIGH (ref 4.0–10.5)
nRBC: 0 % (ref 0.0–0.2)

## 2018-11-03 LAB — BRAIN NATRIURETIC PEPTIDE: B Natriuretic Peptide: 46.5 pg/mL (ref 0.0–100.0)

## 2018-11-03 MED ORDER — LEVOTHYROXINE SODIUM 50 MCG PO TABS
50.0000 ug | ORAL_TABLET | Freq: Every day | ORAL | Status: DC
  Administered 2018-11-04 – 2018-11-05 (×2): 50 ug via ORAL
  Filled 2018-11-03 (×3): qty 1

## 2018-11-03 MED ORDER — SODIUM CHLORIDE 0.9 % IV SOLN
1.0000 g | Freq: Once | INTRAVENOUS | Status: AC
  Administered 2018-11-03: 1 g via INTRAVENOUS
  Filled 2018-11-03: qty 10

## 2018-11-03 MED ORDER — ACETAMINOPHEN 325 MG PO TABS
650.0000 mg | ORAL_TABLET | Freq: Four times a day (QID) | ORAL | Status: DC | PRN
  Filled 2018-11-03: qty 2

## 2018-11-03 MED ORDER — SODIUM CHLORIDE 0.9 % IV SOLN
500.0000 mg | Freq: Once | INTRAVENOUS | Status: AC
  Administered 2018-11-03: 500 mg via INTRAVENOUS
  Filled 2018-11-03: qty 500

## 2018-11-03 MED ORDER — ZOLPIDEM TARTRATE 5 MG PO TABS
5.0000 mg | ORAL_TABLET | Freq: Once | ORAL | Status: AC
  Administered 2018-11-03: 5 mg via ORAL
  Filled 2018-11-03: qty 1

## 2018-11-03 MED ORDER — INSULIN ASPART 100 UNIT/ML ~~LOC~~ SOLN: 0.0000 [IU] | Freq: Three times a day (TID) | SUBCUTANEOUS | Status: DC

## 2018-11-03 MED ORDER — ACETAMINOPHEN 650 MG RE SUPP: 650.0000 mg | Freq: Four times a day (QID) | RECTAL | Status: DC | PRN

## 2018-11-03 MED ORDER — SODIUM CHLORIDE 0.9% FLUSH
3.0000 mL | Freq: Two times a day (BID) | INTRAVENOUS | Status: DC
  Administered 2018-11-03 – 2018-11-05 (×4): 3 mL via INTRAVENOUS

## 2018-11-03 MED ORDER — SODIUM CHLORIDE 0.9 % IV SOLN
2.0000 g | INTRAVENOUS | Status: DC
  Administered 2018-11-04: 2 g via INTRAVENOUS
  Filled 2018-11-03 (×2): qty 20

## 2018-11-03 MED ORDER — SODIUM CHLORIDE 0.9 % IV SOLN: INTRAVENOUS | Status: DC

## 2018-11-03 MED ORDER — INSULIN ASPART 100 UNIT/ML ~~LOC~~ SOLN: 0.0000 [IU] | Freq: Every day | SUBCUTANEOUS | Status: DC

## 2018-11-03 MED ORDER — RIVAROXABAN 20 MG PO TABS
20.0000 mg | ORAL_TABLET | Freq: Every day | ORAL | Status: DC
  Administered 2018-11-03 – 2018-11-05 (×3): 20 mg via ORAL
  Filled 2018-11-03 (×3): qty 1

## 2018-11-03 NOTE — Progress Notes (Signed)
Pt has been on xarelto 15mg  PO qday for his hx of PE. He weighs >100kg, scr 1.44, therefore, his CrCl>50. D/w Dr. Tamala Julian, we will increase his Xarelto to 20mg  qday.   Onnie Boer, PharmD, BCIDP, AAHIVP, CPP Infectious Disease Pharmacist 11/03/2018 9:36 PM

## 2018-11-03 NOTE — H&P (Addendum)
History and Physical    Terrence Beard:[id number] DOB: 01/30/1941 DOA: 11/03/2018  Referring MD/NP/PA: Lajean Saver, MD PCP: Josetta Huddle, MD  Patient coming from:  Home   Chief Complaint: Trouble breathing  I have personally briefly reviewed patient's old medical records in [place]   HPI: Terrence Beard is a 78 y.o. male with medical history significant of RBBB/trifascicular block, DVT on anticoagulation, hypothyroidism, prediabetes, CKDstage III, and GERD; who presents with complaints of trouble breathing over the last 4 to 5 weeks.  He has had a productive cough with intermittent chest tightness. It is hard for him to take deep breath in due to symptoms.  Patient was evaluated by his primary care provider and just recently finished a Z-Pak 2 days ago.  He reports that the cough is no longer productive.  Associated symptoms include reports of fever up to 99.6 F, nasal congestion, orthopnea, urinary frequency, insomnia, and headache that he relates to poor sleep.  Denies having any chest pain, leg swelling, sore throat, tick bites(as patient hunt), or rash.  Patient reports that he previously had issues with his heart, but did not follow-up with cardiology due to fear of procedures discussed.  He has been compliant with his medications of Xarelto and takes them as prescribed.  He has been told that he has had prediabetes in the past.  ED Course: Upon admission into the emergency department patient was seen to have a temperature of 99.3 F, pulse 64-77, respirations 18-28, blood pressures 135/93-160 2/87, and O2 saturations maintained on room air.  Labs revealed WBC 15.5, platelets 444, sodium 132, BUN 21, creatinine 1.44, glucose 287, BNP 46.5, troponin negative x2.  Chest x-ray showed low lung volumes and question atelectasis versus infection.  Patient was started on empiric antibiotics of azithromycin and Rocephin.  [facility] called to admit suspected community-acquired pneumonia.    Review of Systems  Constitutional: Positive for fever and malaise/fatigue.  HENT: Positive for congestion. Negative for ear discharge.   Eyes: Negative for photophobia and pain.  Respiratory: Positive for cough, sputum production and shortness of breath.   Cardiovascular: Positive for orthopnea. Negative for chest pain and leg swelling.  Gastrointestinal: Negative for abdominal pain, nausea and vomiting.  Genitourinary: Positive for frequency. Negative for dysuria.  Musculoskeletal: Negative for falls and myalgias.  Skin: Negative for itching and rash.  Neurological: Positive for headaches. Negative for focal weakness and loss of consciousness.  Psychiatric/Behavioral: Negative for memory loss. The patient has insomnia.     Past Medical History:  Diagnosis Date  . Blood clot associated with vein wall inflammation    R. calf   . CKD (chronic kidney disease) stage 3, GFR 30-59 ml/min (HCC)    not treated  . DJD (degenerative joint disease)   . Dysphagia   . Erectile dysfunction   . GERD (gastroesophageal reflux disease)   . Glucose intolerance (impaired glucose tolerance)   . History of echocardiogram    a. Echo 7/14:  EF 60-65%, mod asymmetric LVH, Mild LAE, mild to mod MR,   . History of nuclear stress test    a. ETT-Myoview 7/14:  EF 71%, normal perfusion, low risk  . Hypothyroidism   . Multiple lipomas   . Nasal polyposis   . Obesity   . RBBB   . Seasonal allergies     Past Surgical History:  Procedure Laterality Date  . COLONOSCOPY WITH PROPOFOL N/A 02/05/2016   Procedure: COLONOSCOPY WITH PROPOFOL;  Surgeon: Garlan Fair,  MD;  Location: [facility];  Service: Endoscopy;  Laterality: N/A;  . LYMPH NODE BIOPSY    . TONSILLECTOMY    . TOOTH EXTRACTION  10/07/2016     reports that he has never smoked. He has never used smokeless tobacco. He reports current alcohol use. He reports that he does not use drugs.  Allergies  Allergen Reactions  . Latex     Family  History  Problem Relation Age of Onset  . GER disease Father        died in his 47s  . Kidney disease Mother   . Diabetes Maternal Grandmother   . Heart attack Neg Hx     Prior to Admission medications   Medication Sig Start Date End Date Taking? Authorizing Provider  calcium carbonate (TUMS - DOSED IN MG ELEMENTAL CALCIUM) 500 MG chewable tablet Chew 2-3 tablets by mouth at bedtime as needed for indigestion or heartburn.   Yes [provider]  Cholecalciferol (VITAMIN D) 2000 UNITS tablet Take 2,000 Units by mouth daily.    Yes [provider]  levothyroxine (SYNTHROID, LEVOTHROID) 50 MCG tablet Take 50 mcg by mouth daily before breakfast.   Yes [provider]  Rivaroxaban (XARELTO) 15 MG TABS tablet Take 15 mg by mouth daily.    Yes [provider]  Rivaroxaban 15 & 20 MG TBPK Take as directed on package: Start with one 15mg  tablet by mouth twice a day with food. On Day 22, switch to one 20mg  tablet once a day with food. Patient not taking: Reported on 11/03/2018 10/28/16   Virgel Manifold, MD    Physical Exam:  Constitutional: Obese male who appears to be in no acute distress at this time Vitals:   11/03/18 1619 11/03/18 1645 11/03/18 1900 11/03/18 1949  BP:  (!) 141/76 (!) 135/93 (!) 162/84  Pulse:  67 64 66  Resp:  (!) 27 (!) 27 (!) 28  Temp: 99.3 F (37.4 C)     SpO2:  93% 97% 96%  Weight:      Height:       Eyes: PERRL, lids and conjunctivae normal ENMT: Mucous membranes are moist. Posterior pharynx clear of any exudate or lesions.Normal dentition.  Neck: normal, supple, no masses, no thyromegaly Respiratory: Normal respiratory effort with some crackles appreciated in the lower lung fields and rales of the lower lung fields. Cardiovascular: Regularly irregular, no murmurs / rubs / gallops.  At least +1 pitting extremity edema. 2+ pedal pulses. No carotid bruits.  Abdomen: no tenderness, no masses palpated. No hepatosplenomegaly. Bowel  sounds positive.  Musculoskeletal: no clubbing / cyanosis. No joint deformity upper and lower extremities. Good ROM, no contractures. Normal muscle tone.  Skin: no rashes, lesions, ulcers. No induration Neurologic: CN 2-12 grossly intact. Sensation intact, DTR normal. Strength 5/5 in all 4.  Psychiatric: Normal judgment and insight. Alert and oriented x 3. Normal mood.     Labs on Admission: I have personally reviewed following labs and imaging studies  CBC: Recent Labs  Lab 11/03/18 1618  WBC 15.5*  HGB 14.2  HCT 43.4  MCV 91.0  PLT 481*   Basic Metabolic Panel: Recent Labs  Lab 11/03/18 1636  NA 132*  K 4.3  CL 101  CO2 20*  GLUCOSE 287*  BUN 21  CREATININE 1.44*  CALCIUM 9.5   GFR: Estimated Creatinine Clearance: 54.5 mL/min (A) (by C-G formula based on SCr of 1.44 mg/dL (H)). Liver Function Tests: Recent Labs  Lab 11/03/18 1636  AST 28  ALT 52*  ALKPHOS 65  BILITOT 0.6  PROT 6.8  ALBUMIN 3.1*   No results for input(s): LIPASE, AMYLASE in the last 168 hours. No results for input(s): AMMONIA in the last 168 hours. Coagulation Profile: No results for input(s): INR, PROTIME in the last 168 hours. Cardiac Enzymes: No results for input(s): CKTOTAL, CKMB, CKMBINDEX, TROPONINI in the last 168 hours. BNP (last 3 results) No results for input(s): PROBNP in the last 8760 hours. HbA1C: No results for input(s): HGBA1C in the last 72 hours. CBG: No results for input(s): GLUCAP in the last 168 hours. Lipid Profile: No results for input(s): CHOL, HDL, LDLCALC, TRIG, CHOLHDL, LDLDIRECT in the last 72 hours. Thyroid Function Tests: No results for input(s): TSH, T4TOTAL, FREET4, T3FREE, THYROIDAB in the last 72 hours. Anemia Panel: No results for input(s): VITAMINB12, FOLATE, FERRITIN, TIBC, IRON, RETICCTPCT in the last 72 hours. Urine analysis: No results found for: COLORURINE, APPEARANCEUR, LABSPEC, PHURINE, GLUCOSEU, HGBUR, BILIRUBINUR, KETONESUR, PROTEINUR,  UROBILINOGEN, NITRITE, LEUKOCYTESUR Sepsis Labs: No results found for this or any previous visit (from the past 240 hour(s)).   Radiological Exams on Admission: Dg Chest 2 View  Result Date: 11/03/2018 CLINICAL DATA:  Chest pressure and shortness of breath EXAM: CHEST - 2 VIEW COMPARISON:  11/13/2016 FINDINGS: Chronic cardiomegaly. Low volume chest with interstitial crowding and streaky density. Trace pleural effusions. No Kerley lines or air bronchogram. IMPRESSION: 1. Low volume chest with atelectasis or infection at the bases. A similar appearance was seen on 11/13/2016 chest x-ray. 2. Trace pleural effusions. Electronically Signed   By: Monte Fantasia M.D.   On: 11/03/2018 17:31    EKG: Independently reviewed.  Atrial flutter at 69 bpm and QTc 497  Assessment/Plan Community-acquired pneumonia: Acute.  Patient presents with complaints of shortness of breath and cough.  Chest x-ray poor inspiratory effort possible atelectasis versus infection at the bases.  Patient is on Xarelto and therefore low suspicion for PE. - Admit to a telemetry bed - Follow-up blood  and sputum cultures - Continue empiric antibiotics of Rocephin  Leukocytosis: Acute.  WBC elevated at 15.5 on admission.  Suspect possible infection - Recheck CBC in a.m.   New onset atrial flutter, history of trifascicular block: Acute.  Patient currently rate controlled and is already on anticoagulation for DVT.  Previously evaluated by Dr. Curt Bears in 2016 for placement of loop recorder for trifascicular block and electrophysiology study, but patient never appeared to have been lost to follow-up. - Check magnesium level - Check echocardiogram - Message sent for consult cardiology in a.m.  Diastolic CHF: Patient does have some signs of edema of the lower extremities.  BNP noted to be 45, but this could be a false negative given patient's weight.  Last echocardiogram noted EF noted to be 60 to 65% in 05/2013 - Strict intake and  output - Daily weights - Give 20 mg of Lasix IV - Reassess in a.m. and determine if need of further diuresis  Hyperglycemia: Acute.  Patient appears to have elevated blood glucose of 287 on admission.  No previous reported history of diabetes and is currently not on any medication. - Hypoglycemic protocols - Check hemoglobin A1c - CBGs q. before meals and at bedtime  History of DVT/PE: Patient found to have DVT back in 06/2016. - Continue Xarelto with dose adjusted for kidney function  Hyponatremia: Chronic.  Patient presents with initial sodium 132 on admission which does not appear to be new when reviewing records from 2017. -  IV fluids of normal saline overnight and 100 mL/h - Recheck sodium in a.m.  Chronic kidney disease stage III: Patient presents with a creatinine of 1.44 with BUN 21. Records show previous baseline creatinine to be around 1.30 in 2017.   - Gentle IV fluids overnight - Recheck creatinine in a.m.  Hypothyroidism  - Check TSH level - Continue levothyroxine   DVT prophylaxis: Xarelto Code Status: Full  Family Communication: Discussed plan of care with the patient and family present at bedside Disposition Plan: Likely discharge home in 2 to 3 days Consults called: none  Admission status:inpatient  Norval Morton MD [facility] Pager [phone]   If 7PM-7AM, please contact night-coverage [email or web address] Password [email or web address]  11/03/2018, 9:07 PM

## 2018-11-03 NOTE — ED Provider Notes (Signed)
[facility] EMERGENCY DEPARTMENT Provider Note   CSN: [id number] Arrival date & time: 11/03/18  1603     History   Chief Complaint Chief Complaint  Patient presents with  . Shortness of Breath    HPI Terrence Beard is a 78 y.o. male.  Patient c/o mild sob, chest congestion, intermittent cough, for past 1-2 months. States recent finished zpack, but symptoms persist. Symptoms gradual onset, mild-mod, persistent, slowly worse. States has appt to see pcp in 2 days, but wasn't feeling better today. States chest feels tight at times, congested. Denies leg pain or swelling. Hx PE, states compliant w anticoag therapy. Cough occasionally productive clear/yellowish phlegm. No sore throat. +nasal congestion. Low grade fever for past couple days.   The history is provided by the patient.  Shortness of Breath  Associated symptoms include a fever, cough and chest pain. Pertinent negatives include no headaches, no sore throat, no neck pain, no vomiting, no abdominal pain, no rash and no leg swelling.    Past Medical History:  Diagnosis Date  . Blood clot associated with vein wall inflammation    R. calf   . CKD (chronic kidney disease) stage 3, GFR 30-59 ml/min (HCC)    not treated  . DJD (degenerative joint disease)   . Dysphagia   . Erectile dysfunction   . GERD (gastroesophageal reflux disease)   . Glucose intolerance (impaired glucose tolerance)   . History of echocardiogram    a. Echo 7/14:  EF 60-65%, mod asymmetric LVH, Mild LAE, mild to mod MR,   . History of nuclear stress test    a. ETT-Myoview 7/14:  EF 71%, normal perfusion, low risk  . Hypothyroidism   . Multiple lipomas   . Nasal polyposis   . Obesity   . RBBB   . Seasonal allergies     Patient Active Problem List   Diagnosis Date Noted  . Abdominal bloating 03/18/2018  . DVT (deep venous thrombosis) ([place]) 10/16/2016  . Pulmonary embolism ([place]) 10/16/2016  . CAP (community acquired pneumonia)  10/16/2016  . Hyperglycemia 10/16/2016  . Other pulmonary embolism without acute cor pulmonale ([place])   . RBBB   . Hypothyroidism   . CKD (chronic kidney disease)     Past Surgical History:  Procedure Laterality Date  . COLONOSCOPY WITH PROPOFOL N/A 02/05/2016   Procedure: COLONOSCOPY WITH PROPOFOL;  Surgeon: Garlan Fair, MD;  Location: [facility];  Service: Endoscopy;  Laterality: N/A;  . LYMPH NODE BIOPSY    . TONSILLECTOMY    . TOOTH EXTRACTION  10/07/2016        Home Medications    Prior to Admission medications   Medication Sig Start Date End Date Taking? Authorizing Provider  calcium carbonate (TUMS - DOSED IN MG ELEMENTAL CALCIUM) 500 MG chewable tablet Chew 2-3 tablets by mouth at bedtime as needed for indigestion or heartburn.    [provider]  Cholecalciferol (VITAMIN D) 2000 UNITS tablet Take 2,000 Units by mouth 2 (two) times a week.     [provider]  levothyroxine (SYNTHROID, LEVOTHROID) 50 MCG tablet Take 50 mcg by mouth daily before breakfast.    [provider]  Rivaroxaban 15 & 20 MG TBPK Take as directed on package: Start with one 15mg  tablet by mouth twice a day with food. On Day 22, switch to one 20mg  tablet once a day with food. 10/28/16   Virgel Manifold, MD  sildenafil (VIAGRA) 100 MG tablet Take 100 mg by  mouth daily as needed for erectile dysfunction.    [provider]  vitamin E 100 UNIT capsule Take 100 Units by mouth once a week.    [provider]    Family History Family History  Problem Relation Age of Onset  . GER disease Father        died in his 23s  . Kidney disease Mother   . Diabetes Maternal Grandmother   . Heart attack Neg Hx     Social History Social History   Tobacco Use  . Smoking status: Never Smoker  . Smokeless tobacco: Never Used  Substance Use Topics  . Alcohol use: Yes    Comment: twice/mo  . Drug use: No     Allergies   Latex   Review of Systems Review of  Systems  Constitutional: Positive for fever.  HENT: Positive for congestion. Negative for sore throat.   Eyes: Negative for redness.  Respiratory: Positive for cough and shortness of breath.   Cardiovascular: Positive for chest pain. Negative for leg swelling.  Gastrointestinal: Negative for abdominal pain and vomiting.  Genitourinary: Negative for flank pain.  Musculoskeletal: Negative for back pain and neck pain.  Skin: Negative for rash.  Neurological: Negative for headaches.  Hematological: Does not bruise/bleed easily.  Psychiatric/Behavioral: Negative for confusion.     Physical Exam Updated Vital Signs BP (!) 162/87   Pulse 77   Temp 99.3 F (37.4 C)   Resp 18   Ht 1.829 m (6')   Wt 104.3 kg   SpO2 93%   BMI 31.19 kg/m   Physical Exam Vitals signs and nursing note reviewed.  Constitutional:      Appearance: He is well-developed.  HENT:     Head: Atraumatic.     Nose: Congestion present.     Mouth/Throat:     Mouth: Mucous membranes are moist.  Eyes:     Conjunctiva/sclera: Conjunctivae normal.  Neck:     Musculoskeletal: Normal range of motion and neck supple. No neck rigidity or muscular tenderness.     Trachea: No tracheal deviation.  Cardiovascular:     Rate and Rhythm: Normal rate and regular rhythm.     Pulses: Normal pulses.     Heart sounds: Normal heart sounds. No murmur. No friction rub. No gallop.   Pulmonary:     Effort: Pulmonary effort is normal. No accessory muscle usage or respiratory distress.     Breath sounds: Rales present.     Comments: Rales RLL.  Abdominal:     General: Bowel sounds are normal. There is no distension.     Palpations: Abdomen is soft.     Tenderness: There is no abdominal tenderness. There is no guarding.  Genitourinary:    Comments: No cva tenderness.  Musculoskeletal:        General: No tenderness.     Right lower leg: No edema.     Left lower leg: No edema.  Lymphadenopathy:     Cervical: No cervical  adenopathy.  Skin:    General: Skin is warm and dry.     Findings: No rash.  Neurological:     Mental Status: He is alert.     Comments: Speech clear/fluent.   Psychiatric:        Mood and Affect: Mood normal.      ED Treatments / Results  Labs (all labs ordered are listed, but only abnormal results are displayed) Results for orders placed or performed during the hospital encounter of  11/03/18  CBC  Result Value Ref Range   WBC 15.5 (H) 4.0 - 10.5 K/uL   RBC 4.77 4.22 - 5.81 MIL/uL   Hemoglobin 14.2 13.0 - 17.0 g/dL   HCT 43.4 39.0 - 52.0 %   MCV 91.0 80.0 - 100.0 fL   MCH 29.8 26.0 - 34.0 pg   MCHC 32.7 30.0 - 36.0 g/dL   RDW 14.0 11.5 - 15.5 %   Platelets 444 (H) 150 - 400 K/uL   nRBC 0.0 0.0 - 0.2 %  Brain natriuretic peptide  Result Value Ref Range   B Natriuretic Peptide 46.5 0.0 - 100.0 pg/mL  Comprehensive metabolic panel  Result Value Ref Range   Sodium 132 (L) 135 - 145 mmol/L   Potassium 4.3 3.5 - 5.1 mmol/L   Chloride 101 98 - 111 mmol/L   CO2 20 (L) 22 - 32 mmol/L   Glucose, Bld 287 (H) 70 - 99 mg/dL   BUN 21 8 - 23 mg/dL   Creatinine, Ser 1.44 (H) 0.61 - 1.24 mg/dL   Calcium 9.5 8.9 - 10.3 mg/dL   Total Protein 6.8 6.5 - 8.1 g/dL   Albumin 3.1 (L) 3.5 - 5.0 g/dL   AST 28 15 - 41 U/L   ALT 52 (H) 0 - 44 U/L   Alkaline Phosphatase 65 38 - 126 U/L   Total Bilirubin 0.6 0.3 - 1.2 mg/dL   GFR calc non Af Amer 47 (L) >60 mL/min   GFR calc Af Amer 54 (L) >60 mL/min   Anion gap 11 5 - 15  I-stat troponin, ED  Result Value Ref Range   Troponin i, poc 0.02 0.00 - 0.08 ng/mL   Comment 3           Dg Chest 2 View  Result Date: 11/03/2018 CLINICAL DATA:  Chest pressure and shortness of breath EXAM: CHEST - 2 VIEW COMPARISON:  11/13/2016 FINDINGS: Chronic cardiomegaly. Low volume chest with interstitial crowding and streaky density. Trace pleural effusions. No Kerley lines or air bronchogram. IMPRESSION: 1. Low volume chest with atelectasis or infection at the  bases. A similar appearance was seen on 11/13/2016 chest x-ray. 2. Trace pleural effusions. Electronically Signed   By: Monte Fantasia M.D.   On: 11/03/2018 17:31    EKG EKG Interpretation  Date/Time:  Wednesday November 03 2018 19:35:46 EST Ventricular Rate:  69 PR Interval:    QRS Duration: 140 QT Interval:  463 QTC Calculation: 497 R Axis:   -76 Text Interpretation:  Atrial flutter Short PR interval RBBB and LAFB Confirmed by Lajean Saver [phone]) on 11/03/2018 7:45:11 PM   Radiology Dg Chest 2 View  Result Date: 11/03/2018 CLINICAL DATA:  Chest pressure and shortness of breath EXAM: CHEST - 2 VIEW COMPARISON:  11/13/2016 FINDINGS: Chronic cardiomegaly. Low volume chest with interstitial crowding and streaky density. Trace pleural effusions. No Kerley lines or air bronchogram. IMPRESSION: 1. Low volume chest with atelectasis or infection at the bases. A similar appearance was seen on 11/13/2016 chest x-ray. 2. Trace pleural effusions. Electronically Signed   By: Monte Fantasia M.D.   On: 11/03/2018 17:31    Procedures Procedures (including critical care time)  Medications Ordered in ED Medications - No data to display   Initial Impression / Assessment and Plan / ED Course  I have reviewed the triage vital signs and the nursing notes.  Pertinent labs & imaging results that were available during my care of the patient were reviewed by me and considered  in my medical decision making (see chart for details).  Iv ns. Continuous pulse ox and monitor. Ecg.  Cxr. Labs sent.  Reviewed nursing notes and prior charts for additional history.   xrays reviewed - right lower infiltrate. Rales on exam. ?pna. Iv abx given.  Labs reviewed - wbc elevated.    Pt also in aflutter - no noted hx afib/flutter.   Given sob, chest tightness,  Congestion/pna, new aflutter - will admit.   Hospitalists consulted for admission.    Final Clinical Impressions(s) / ED Diagnoses   Final diagnoses:   None    ED Discharge Orders    None       Lajean Saver, MD 11/03/18 [phone]

## 2018-11-03 NOTE — ED Notes (Signed)
Patient transported to X-ray 

## 2018-11-03 NOTE — ED Triage Notes (Signed)
The pt is c/o sob for one week chills and a low grade temp  He just finished  A rx zithromax.  Hx pe

## 2018-11-04 ENCOUNTER — Other Ambulatory Visit: Payer: Self-pay

## 2018-11-04 DIAGNOSIS — E871 Hypo-osmolality and hyponatremia: Secondary | ICD-10-CM | POA: Diagnosis present

## 2018-11-04 DIAGNOSIS — J189 Pneumonia, unspecified organism: Principal | ICD-10-CM

## 2018-11-04 DIAGNOSIS — I483 Typical atrial flutter: Secondary | ICD-10-CM

## 2018-11-04 DIAGNOSIS — I4892 Unspecified atrial flutter: Secondary | ICD-10-CM | POA: Diagnosis present

## 2018-11-04 LAB — BLOOD CULTURE ID PANEL (REFLEXED)
Acinetobacter baumannii: NOT DETECTED
CANDIDA GLABRATA: NOT DETECTED
CANDIDA KRUSEI: NOT DETECTED
Candida albicans: NOT DETECTED
Candida parapsilosis: NOT DETECTED
Candida tropicalis: NOT DETECTED
ENTEROBACTER CLOACAE COMPLEX: NOT DETECTED
Enterobacteriaceae species: NOT DETECTED
Enterococcus species: NOT DETECTED
Escherichia coli: NOT DETECTED
Haemophilus influenzae: NOT DETECTED
Klebsiella oxytoca: NOT DETECTED
Klebsiella pneumoniae: NOT DETECTED
Listeria monocytogenes: NOT DETECTED
Methicillin resistance: NOT DETECTED
Neisseria meningitidis: NOT DETECTED
Proteus species: NOT DETECTED
Pseudomonas aeruginosa: NOT DETECTED
STREPTOCOCCUS PYOGENES: NOT DETECTED
Serratia marcescens: NOT DETECTED
Staphylococcus aureus (BCID): NOT DETECTED
Staphylococcus species: DETECTED — AB
Streptococcus agalactiae: NOT DETECTED
Streptococcus pneumoniae: NOT DETECTED
Streptococcus species: NOT DETECTED

## 2018-11-04 LAB — GLUCOSE, CAPILLARY: GLUCOSE-CAPILLARY: 146 mg/dL — AB (ref 70–99)

## 2018-11-04 LAB — URINALYSIS, ROUTINE W REFLEX MICROSCOPIC
Bilirubin Urine: NEGATIVE
GLUCOSE, UA: 150 mg/dL — AB
Hgb urine dipstick: NEGATIVE
Ketones, ur: NEGATIVE mg/dL
Leukocytes, UA: NEGATIVE
Nitrite: NEGATIVE
Protein, ur: NEGATIVE mg/dL
Specific Gravity, Urine: 1.03 (ref 1.005–1.030)
pH: 5 (ref 5.0–8.0)

## 2018-11-04 LAB — CBC
HCT: 40.9 % (ref 39.0–52.0)
Hemoglobin: 13.2 g/dL (ref 13.0–17.0)
MCH: 29 pg (ref 26.0–34.0)
MCHC: 32.3 g/dL (ref 30.0–36.0)
MCV: 89.9 fL (ref 80.0–100.0)
Platelets: 365 10*3/uL (ref 150–400)
RBC: 4.55 MIL/uL (ref 4.22–5.81)
RDW: 13.9 % (ref 11.5–15.5)
WBC: 18.2 10*3/uL — ABNORMAL HIGH (ref 4.0–10.5)
nRBC: 0 % (ref 0.0–0.2)

## 2018-11-04 LAB — INFLUENZA PANEL BY PCR (TYPE A & B)
Influenza A By PCR: NEGATIVE
Influenza B By PCR: NEGATIVE

## 2018-11-04 LAB — BASIC METABOLIC PANEL
Anion gap: 11 (ref 5–15)
BUN: 15 mg/dL (ref 8–23)
CO2: 21 mmol/L — ABNORMAL LOW (ref 22–32)
Calcium: 9.3 mg/dL (ref 8.9–10.3)
Chloride: 102 mmol/L (ref 98–111)
Creatinine, Ser: 1.36 mg/dL — ABNORMAL HIGH (ref 0.61–1.24)
GFR calc Af Amer: 58 mL/min — ABNORMAL LOW (ref >60)
GFR calc non Af Amer: 50 mL/min — ABNORMAL LOW (ref >60)
Glucose, Bld: 146 mg/dL — ABNORMAL HIGH (ref 70–99)
Potassium: 4.1 mmol/L (ref 3.5–5.1)
Sodium: 134 mmol/L — ABNORMAL LOW (ref 135–145)

## 2018-11-04 LAB — STREP PNEUMONIAE URINARY ANTIGEN: Strep Pneumo Urinary Antigen: NEGATIVE

## 2018-11-04 LAB — MAGNESIUM: MAGNESIUM: 2.2 mg/dL (ref 1.7–2.4)

## 2018-11-04 LAB — HEMOGLOBIN A1C
Hgb A1c MFr Bld: 5.8 % — ABNORMAL HIGH (ref 4.8–5.6)
Mean Plasma Glucose: 119.76 mg/dL

## 2018-11-04 LAB — TSH: TSH: 3.422 u[IU]/mL (ref 0.350–4.500)

## 2018-11-04 LAB — TROPONIN I: Troponin I: <0.03 ng/mL (ref <0.03)

## 2018-11-04 MED ORDER — FUROSEMIDE 10 MG/ML IJ SOLN
20.0000 mg | Freq: Once | INTRAMUSCULAR | Status: AC
  Administered 2018-11-04: 20 mg via INTRAVENOUS
  Filled 2018-11-04: qty 2

## 2018-11-04 MED ORDER — ZOLPIDEM TARTRATE 5 MG PO TABS
5.0000 mg | ORAL_TABLET | Freq: Every evening | ORAL | Status: DC | PRN
  Administered 2018-11-04: 5 mg via ORAL
  Filled 2018-11-04: qty 1

## 2018-11-04 NOTE — Progress Notes (Signed)
PHARMACY - PHYSICIAN COMMUNICATION CRITICAL VALUE ALERT - BLOOD CULTURE IDENTIFICATION (BCID)  Terrence Beard is an 78 y.o. male who presented to [facility] on 11/03/2018 with a chief complaint of PNA  Assessment:  He recently failed a course of zpak outpt for his PNA. He was started on ceftriaxone here for PNA. Urinary strep neg. Legionella pending. Flu neg. Labs called with BCID result>>1/4 bottles with staph species (mecA neg). This likely a contaminant. Even if it's real, ceftriaxone would cover this anyway since it's mecA neg.   Name of physician (or Provider) Contacted: K Schorr, PA  Current antibiotics: Ceftriaxone  Changes to prescribed antibiotics recommended:  None  Onnie Boer, PharmD, [place], AAHIVP, CPP Infectious Disease Pharmacist 11/04/2018 8:23 PM

## 2018-11-04 NOTE — Progress Notes (Signed)
PROGRESS NOTE    Terrence Beard  JSH:[id number] DOB: Jan 22, 1941 DOA: 11/03/2018 PCP: Josetta Huddle, MD  Brief Narrative: Terrence Beard is a 78 y.o. male with medical history significant of RBBB/trifascicular block, DVT on anticoagulation, hypothyroidism, prediabetes, CKDstage III, and GERD; who presents with complaints of trouble breathing over the last 4 to 5 weeks.  He has had a productive cough with intermittent chest tightness. It is hard for him to take deep breath in due to symptoms.  Patient was evaluated by his primary care provider and just recently finished a Z-Pak 2 days ago.  -In the emergency room his temperature was 99.7, mild leukocytosis, BMP was within normal limits, chest x-ray showed low lung volumes and question atelectasis versus infection   Assessment & Plan:  Community-acquired pneumonia: -Admitted with productive cough congestion, low-grade fevers, chills -Check influenza PCR -Clinically improving, continue empiric ceftriaxone -Wean O2, ambulate, PT OT eval -Blood cultures are negative  New onset atrial flutter -History of right bundle blanch block and trifascicular block -At baseline on Xarelto for prior PE -Ventricular rate is controlled -Continue Xarelto dose increased to 20 mg, suspect pneumonia could have triggered this -Appreciate EP input recommended outpatient follow-up  Chronic diastolic CHF -Was given a single dose of Lasix on admission yesterday -Clinically appears euvolemic, last echocardiogram noted EF of 60 to 65% in 2014, repeat echo ordered we will follow-up  Transient hyperglycemia -CBG was 287 on admission, history of prediabetes -Hemoglobin A1c is 5.8 -Sliding scale insulin for now  History of DVT/PE in 8/201 7 -Continue Xarelto, dose increased due to atrial flutter to 20 mg daily  Chronic kidney disease stage III:  -Patient presented with a creatinine of 1.44 with BUN 21,  previous baseline creatinine to be around 1.30 in 2017.    -Creatinine stable  Hypothyroidism  -TSH within normal range - Continue levothyroxine   DVT prophylaxis: Xarelto Code Status: Full  Family Communication:  No family at bedside Disposition Plan:  Home in 1 to 2 days pending improvement in respiratory status  Consultants:   Cardiology/EP   Procedures:   Antimicrobials:    Subjective: -Breathing better, not back to baseline yet -Heart rate controlled this morning  Objective: Vitals:   11/03/18 1949 11/03/18 2000 11/03/18 2216 11/04/18 0538  BP: (!) 162/84 (!) 153/83 (!) 148/98 (!) 150/70  Pulse: 66 66 62 (!) 56  Resp: (!) 28 (!) 29 18 18   Temp:   99.6 F (37.6 C) 98.6 F (37 C)  TempSrc:   Oral Oral  SpO2: 96% 100% 98% 95%  Weight:   105.4 kg 104.3 kg  Height:   5\' 11"  (1.803 m)     Intake/Output Summary (Last 24 hours) at 11/04/2018 1256 Last data filed at 11/04/2018 8502 Gross per 24 hour  Intake 486 ml  Output 200 ml  Net 286 ml   Filed Weights   11/03/18 1614 11/03/18 2216 11/04/18 0538  Weight: 104.3 kg 105.4 kg 104.3 kg    Examination:  Gen: Awake, Alert, Oriented X 3, elderly, pleasant male, sitting up in bed, no distress HEENT: PERRLA, Neck supple, no JVD Lungs: Good air movement, few scattered rhonchi CVS: S1-S2/irregularly irregular rhythm Abd: soft, Non tender, non distended, BS present Extremities: No edema Skin: no new rashes Psychiatry: Judgement and insight appear normal. Mood & affect appropriate.     Data Reviewed:   CBC: Recent Labs  Lab 11/03/18 1618 11/04/18 0357  WBC 15.5* 18.2*  HGB 14.2 13.2  HCT 43.4 40.9  MCV 91.0 89.9  PLT 444* 767   Basic Metabolic Panel: Recent Labs  Lab 11/03/18 1636 11/03/18 2258 11/04/18 0357  NA 132*  --  134*  K 4.3  --  4.1  CL 101  --  102  CO2 20*  --  21*  GLUCOSE 287*  --  146*  BUN 21  --  15  CREATININE 1.44*  --  1.36*  CALCIUM 9.5  --  9.3  MG  --  2.2  --    GFR: Estimated Creatinine Clearance: 56.8 mL/min (A) (by  C-G formula based on SCr of 1.36 mg/dL (H)). Liver Function Tests: Recent Labs  Lab 11/03/18 1636  AST 28  ALT 52*  ALKPHOS 65  BILITOT 0.6  PROT 6.8  ALBUMIN 3.1*   No results for input(s): LIPASE, AMYLASE in the last 168 hours. No results for input(s): AMMONIA in the last 168 hours. Coagulation Profile: No results for input(s): INR, PROTIME in the last 168 hours. Cardiac Enzymes: Recent Labs  Lab 11/04/18 0357  TROPONINI <0.03   BNP (last 3 results) No results for input(s): PROBNP in the last 8760 hours. HbA1C: Recent Labs    11/04/18 0357  HGBA1C 5.8*   CBG: Recent Labs  Lab 11/03/18 2242  GLUCAP 146*   Lipid Profile: No results for input(s): CHOL, HDL, LDLCALC, TRIG, CHOLHDL, LDLDIRECT in the last 72 hours. Thyroid Function Tests: Recent Labs    11/03/18 2258  TSH 3.422   Anemia Panel: No results for input(s): VITAMINB12, FOLATE, FERRITIN, TIBC, IRON, RETICCTPCT in the last 72 hours. Urine analysis:    Component Value Date/Time   COLORURINE YELLOW 11/04/2018 0026   APPEARANCEUR TURBID (A) 11/04/2018 0026   LABSPEC 1.030 11/04/2018 0026   PHURINE 5.0 11/04/2018 0026   GLUCOSEU 150 (A) 11/04/2018 0026   HGBUR NEGATIVE 11/04/2018 0026   BILIRUBINUR NEGATIVE 11/04/2018 0026   KETONESUR NEGATIVE 11/04/2018 0026   PROTEINUR NEGATIVE 11/04/2018 0026   NITRITE NEGATIVE 11/04/2018 0026   LEUKOCYTESUR NEGATIVE 11/04/2018 0026   Sepsis Labs: @LABRCNTIP (procalcitonin:4,lacticidven:4)  )No results found for this or any previous visit (from the past 240 hour(s)).       Radiology Studies: Dg Chest 2 View  Result Date: 11/03/2018 CLINICAL DATA:  Chest pressure and shortness of breath EXAM: CHEST - 2 VIEW COMPARISON:  11/13/2016 FINDINGS: Chronic cardiomegaly. Low volume chest with interstitial crowding and streaky density. Trace pleural effusions. No Kerley lines or air bronchogram. IMPRESSION: 1. Low volume chest with atelectasis or infection at the bases.  A similar appearance was seen on 11/13/2016 chest x-ray. 2. Trace pleural effusions. Electronically Signed   By: Monte Fantasia M.D.   On: 11/03/2018 17:31        Scheduled Meds: . levothyroxine  50 mcg Oral QAC breakfast  . rivaroxaban  20 mg Oral Q supper  . sodium chloride flush  3 mL Intravenous Q12H   Continuous Infusions: . cefTRIAXone (ROCEPHIN)  IV       LOS: 1 day    Time spent: 73min    Domenic Polite, MD [facility] Page via [email or web address], password [email or web address] After 7PM please contact night-coverage  11/04/2018, 12:56 PM

## 2018-11-04 NOTE — Discharge Instructions (Signed)

## 2018-11-04 NOTE — Consult Note (Addendum)
Cardiology Consultation:   Patient ID: Terrence Beard MRN: [id number]; DOB: [birth year]  Admit date: 11/03/2018 Date of Consult: 11/04/2018  Primary Care Provider: Josetta Huddle, MD Primary Cardiologist: Kathleen Argue, [place] (2016) Primary Electrophysiologist:  Dr. Curt Bears   Patient Profile:   Terrence Beard is a 78 y.o. male with a hx of known trifacsicular block (asymptomatic), hypothyroidism, pre-DM, CKD (III), GERD, and PE on a/c who is being seen today for the evaluation of AFlutter at the request of Dr. Harvest Forest.  History of Present Illness:   Terrence Beard initally seen by [facility] care back in 2016 for abnormal EKG, noting SR, 1st degree AVBlock, Mobitz I, RBBB, LAD.  Dr. Rayann Heman initially weighed in recommending PPM, though the patient without symptoms wanted to avoid this if possible, with plans for ambulatory monitoring and formal EP referral and follow up afterwards, has had f/u with Dr. Curt Bears since then.  Most recently 12/2016.  At that visit had been recently found with a PE, on Xarelto and remained without any symptoms of bradycardia.  He is admitted to [facility] yesterday with SOB.  Initially noted several weeks prior, eventually with a productive cough, treated out patient with a Z-pak that resolved the cough.  There was description of a pleuritic component, pt reported compliance with his xarelto, low grade temp of 99.6 at home, malaise, headache, sinus congestion as well as orthopnea by the H&P.  He was admitted with CAP, noted WBC 15, cxr with atelectasis vs infection.  He was also noted to be on a rate controlled AFlutter and we are asked to see him for this.  He reports for a couple weeks he had noted a feeling like he could not get a good breath in, then developed some congestion/cough (including sinuses) and felt to be a cough/coold type illness, despite antibiotic he continued to have this feeling that was worse, and though no pain on inspiration like his PE symptom, felt  somehow similar.  He self resumed 20mg  of xarelto nightly for the last 3 nights worried that the 15mg  was not enough.  He did not started to feel better until today, says he is breathing much easier this AM (post IV antibiotics and lasix 20mg )  LABS NA+ 132 > 134 K+ 4.1 BUN/Creat 21/1.44 > 15/1.36 Mag 2.2 poc Trop 0.02, 0.01 Trop I <0.03 WBC 15.5 > 18.2 H/H 13/40 Plts 365 TSH 2.422  His PE xarelto dose of 15mg  daily has been up-titrated to 20mg  daily given AFlutter inidcation (Calc Cr.Cl is 68)   Past Medical History:  Diagnosis Date  . Blood clot associated with vein wall inflammation    R. calf   . CKD (chronic kidney disease) stage 3, GFR 30-59 ml/min (HCC)    not treated  . DJD (degenerative joint disease)   . Dysphagia   . Erectile dysfunction   . GERD (gastroesophageal reflux disease)   . Glucose intolerance (impaired glucose tolerance)   . History of echocardiogram    a. Echo 7/14:  EF 60-65%, mod asymmetric LVH, Mild LAE, mild to mod MR,   . History of nuclear stress test    a. ETT-Myoview 7/14:  EF 71%, normal perfusion, low risk  . Hypothyroidism   . Multiple lipomas   . Nasal polyposis   . Obesity   . RBBB   . Seasonal allergies     Past Surgical History:  Procedure Laterality Date  . COLONOSCOPY WITH PROPOFOL N/A 02/05/2016   Procedure: COLONOSCOPY WITH PROPOFOL;  Surgeon: Garlan Fair, MD;  Location: [facility];  Service: Endoscopy;  Laterality: N/A;  . LYMPH NODE BIOPSY    . TONSILLECTOMY    . TOOTH EXTRACTION  10/07/2016     Home Medications:  Prior to Admission medications   Medication Sig Start Date End Date Taking? Authorizing Provider  calcium carbonate (TUMS - DOSED IN MG ELEMENTAL CALCIUM) 500 MG chewable tablet Chew 2-3 tablets by mouth at bedtime as needed for indigestion or heartburn.   Yes [provider]  Cholecalciferol (VITAMIN D) 2000 UNITS tablet Take 2,000 Units by mouth daily.    Yes [provider]    levothyroxine (SYNTHROID, LEVOTHROID) 50 MCG tablet Take 50 mcg by mouth daily before breakfast.   Yes [provider]  Rivaroxaban (XARELTO) 15 MG TABS tablet Take 15 mg by mouth daily.    Yes [provider]  Rivaroxaban 15 & 20 MG TBPK Take as directed on package: Start with one 15mg  tablet by mouth twice a day with food. On Day 22, switch to one 20mg  tablet once a day with food. Patient not taking: Reported on 11/03/2018 10/28/16   Virgel Manifold, MD    Inpatient Medications: Scheduled Meds: . levothyroxine  50 mcg Oral QAC breakfast  . rivaroxaban  20 mg Oral Q supper  . sodium chloride flush  3 mL Intravenous Q12H   Continuous Infusions: . cefTRIAXone (ROCEPHIN)  IV     PRN Meds: acetaminophen **OR** acetaminophen  Allergies:    Allergies  Allergen Reactions  . Latex     Social History:   Social History   Socioeconomic History  . Marital status: Divorced    Spouse name: Not on file  . Number of children: Not on file  . Years of education: Not on file  . Highest education level: Not on file  Occupational History  . 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 Financial resource strain: Not on file  . Food insecurity:    Worry: Not on file    Inability: Not on file  . Transportation needs:    Medical: Not on file    Non-medical: Not on file  Tobacco Use  . Smoking status: Never Smoker  . Smokeless tobacco: Never Used  Substance and Sexual Activity  . Alcohol use: Yes    Comment: twice/mo  . Drug use: No  . Sexual activity: Not on file  Lifestyle  . Physical activity:    Days per week: Not on file    Minutes per session: Not on file  . Stress: Not on file  Relationships  . Social connections:    Talks on phone: Not on file    Gets together: Not on file    Attends religious service: Not on file    Active member of club or organization: Not on file    Attends meetings of clubs or  organizations: Not on file    Relationship status: Not on file  . Intimate partner violence:    Fear of current or ex partner: Not on file    Emotionally abused: Not on file    Physically abused: Not on file    Forced sexual activity: Not on file  Other Topics Concern  . Not on file  Social History Narrative   Hobby:  Vineyard    Family History:   Family History  Problem Relation Age of Onset  . GER disease Father  died in his 11s  . Kidney disease Mother   . Diabetes Maternal Grandmother   . Heart attack Neg Hx      ROS:  Please see the history of present illness.  All other ROS reviewed and negative.     Physical Exam/Data:   Vitals:   11/03/18 1949 11/03/18 2000 11/03/18 2216 11/04/18 0538  BP: (!) 162/84 (!) 153/83 (!) 148/98 (!) 150/70  Pulse: 66 66 62 (!) 56  Resp: (!) 28 (!) 29 18 18   Temp:   99.6 F (37.6 C) 98.6 F (37 C)  TempSrc:   Oral Oral  SpO2: 96% 100% 98% 95%  Weight:   105.4 kg 104.3 kg  Height:   5\' 11"  (1.803 m)     Intake/Output Summary (Last 24 hours) at 11/04/2018 0832 Last data filed at 11/04/2018 0805 Gross per 24 hour  Intake 246 ml  Output 200 ml  Net 46 ml   Filed Weights   11/03/18 1614 11/03/18 2216 11/04/18 0538  Weight: 104.3 kg 105.4 kg 104.3 kg   Body mass index is 32.08 kg/m.  General:  Well nourished, well developed, in no acute distress HEENT: normal Lymph: no adenopathy Neck: no JVD Endocrine:  No thryomegaly Vascular: No carotid bruits Cardiac:  irreg-irreg; 1/6 SM, no gallops or rubs Lungs: CTA b/l, no wheezing, rhonchi or rales  Abd: soft, nontender Ext: no edema Musculoskeletal:  No deformities Skin: warm and dry  Neuro: no gross focal abnormalities noted Psych:  Normal affect   EKG:  The EKG was personally reviewed and demonstrates:    AFlutter, looks typical, V rate 69, RBBB, LAD  12/05/16 SR, Mobitz I looking EKG though no loss of V,  RBBB, LAD (noting similar looking EKGs back to  2016)  Telemetry:  Telemetry was personally reviewed and demonstrates:   AFlutter, rate controlled  Relevant CV Studies:  09/07/15: 7monitor Mean heart rate 67 bpm Maximum heart rate 109 bpm Minimum heart rate 48 bpm Ventricular beats 497 (0.32%) Supraventricular beats 542 (0.3%) Sinus rhythm with first degree AV block Intermittent Mobitz I block  05/20/13: TTE LVEF 60-65% Mild LA enlargement Mildly thickened MV Mild-mod MR AV sclerotic, opens well  Laboratory Data:  Chemistry Recent Labs  Lab 11/03/18 1636 11/04/18 0357  NA 132* 134*  K 4.3 4.1  CL 101 102  CO2 20* 21*  GLUCOSE 287* 146*  BUN 21 15  CREATININE 1.44* 1.36*  CALCIUM 9.5 9.3  GFRNONAA 47* 50*  GFRAA 54* 58*  ANIONGAP 11 11    Recent Labs  Lab 11/03/18 1636  PROT 6.8  ALBUMIN 3.1*  AST 28  ALT 52*  ALKPHOS 65  BILITOT 0.6   Hematology Recent Labs  Lab 11/03/18 1618 11/04/18 0357  WBC 15.5* 18.2*  RBC 4.77 4.55  HGB 14.2 13.2  HCT 43.4 40.9  MCV 91.0 89.9  MCH 29.8 29.0  MCHC 32.7 32.3  RDW 14.0 13.9  PLT 444* 365   Cardiac Enzymes Recent Labs  Lab 11/04/18 0357  TROPONINI <0.03    Recent Labs  Lab 11/03/18 1637 11/03/18 1954  TROPIPOC 0.02 0.01    BNP Recent Labs  Lab 11/03/18 1636  BNP 46.5    DDimer No results for input(s): DDIMER in the last 168 hours.  Radiology/Studies:   Dg Chest 2 View Result Date: 11/03/2018 CLINICAL DATA:  Chest pressure and shortness of breath EXAM: CHEST - 2 VIEW COMPARISON:  11/13/2016 FINDINGS: Chronic cardiomegaly. Low volume chest with interstitial crowding and  streaky density. Trace pleural effusions. No Kerley lines or air bronchogram. IMPRESSION: 1. Low volume chest with atelectasis or infection at the bases. A similar appearance was seen on 11/13/2016 chest x-ray. 2. Trace pleural effusions. Electronically Signed   By: Monte Fantasia M.D.   On: 11/03/2018 17:31    Assessment and Plan:   1. New AFlutter     Unclear onset, ?  Weeks ago?     He is rated controlled     CHA2DS2Vasc is 2 for age, approprietly up-titrated his xareleto to 20mg  daily  Appears typical, may have been triggered by infection ?etiology of symptoms weeks ago, AFlutter? Fluid OL? Known baseline conduction system disease (trifascular block, Mobitz I) DO NOT USE ANY RATE LIMITING AGENTS  update echo Given a single dose of IV lasix early this AM with reports of hood/brisk urine OP, he had not noted/appreciated any edema at home, and does not appear overtly fluid OL by exam   need a/c at the 20mg  dose for 3 weeks prior to rhythm control strategies without TEE.  Would like to follow once better from a pneumonia standpoint given no symptoms and controlled VR  Dr. Curt Bears  see later this morning  2. CAP     Tmax here 99.6, WBC elevated, higher today     C/w IM service  3. HTN, not a listed historical diagnosis     Not on any tx out patient      follow   For questions or updates, please contact [place] Please consult [email or web address] for contact info under     Signed, Baldwin Jamaica, PA-C  11/04/2018 8:32 AM  I have seen and examined this patient with Tommye Standard.  Agree with above, note added to reflect my findings.  On exam, RRR, no murmurs, lungs clear.  Patient into the hospital with weakness, fatigue, and shortness of breath.  Was found to have an elevated white count was diagnosed with pneumonia.  Incidentally he was found to have atrial flutter with controlled ventricular rates.  He had previously been on Xarelto for a DVT and had increased his dose of Xarelto to 20 mg a day 3 days ago.  It is unclear as to the amount of time he has been on therapeutic Xarelto.  Would thus plan to treat his pneumonia and we  arrange for follow-up in EP clinic.  [facility]  sign off.   Medication Recommendations:  xarelto 20 mg daily Other recommendations (labs, testing, etc):  none Follow up as an outpatient:  To be  arranged in EP clinic    M.  MD 11/04/2018 12:20 PM

## 2018-11-05 ENCOUNTER — Inpatient Hospital Stay (HOSPITAL_COMMUNITY): Payer: Medicare Other

## 2018-11-05 DIAGNOSIS — R0789 Other chest pain: Secondary | ICD-10-CM

## 2018-11-05 LAB — BASIC METABOLIC PANEL
ANION GAP: 10 (ref 5–15)
BUN: 17 mg/dL (ref 8–23)
CO2: 24 mmol/L (ref 22–32)
Calcium: 9.1 mg/dL (ref 8.9–10.3)
Chloride: 99 mmol/L (ref 98–111)
Creatinine, Ser: 1.31 mg/dL — ABNORMAL HIGH (ref 0.61–1.24)
GFR calc Af Amer: >60 mL/min (ref >60)
GFR calc non Af Amer: 53 mL/min — ABNORMAL LOW (ref >60)
GLUCOSE: 133 mg/dL — AB (ref 70–99)
Potassium: 4 mmol/L (ref 3.5–5.1)
Sodium: 133 mmol/L — ABNORMAL LOW (ref 135–145)

## 2018-11-05 LAB — CBC
HCT: 39.4 % (ref 39.0–52.0)
Hemoglobin: 12.7 g/dL — ABNORMAL LOW (ref 13.0–17.0)
MCH: 29 pg (ref 26.0–34.0)
MCHC: 32.2 g/dL (ref 30.0–36.0)
MCV: 90 fL (ref 80.0–100.0)
Platelets: 382 10*3/uL (ref 150–400)
RBC: 4.38 MIL/uL (ref 4.22–5.81)
RDW: 13.9 % (ref 11.5–15.5)
WBC: 13.3 10*3/uL — ABNORMAL HIGH (ref 4.0–10.5)
nRBC: 0 % (ref 0.0–0.2)

## 2018-11-05 LAB — ECHOCARDIOGRAM COMPLETE
Height: 71 in
Weight: 3680 oz

## 2018-11-05 MED ORDER — CEFDINIR 300 MG PO CAPS
300.0000 mg | ORAL_CAPSULE | Freq: Two times a day (BID) | ORAL | Status: DC
  Filled 2018-11-05: qty 1

## 2018-11-05 MED ORDER — RIVAROXABAN 20 MG PO TABS: 20.0000 mg | ORAL_TABLET | Freq: Every day | ORAL | 0 refills | Status: AC

## 2018-11-05 MED ORDER — CEFDINIR 300 MG PO CAPS: 300.0000 mg | ORAL_CAPSULE | Freq: Two times a day (BID) | ORAL | 0 refills | Status: AC

## 2018-11-05 NOTE — Care Management Note (Signed)
Case Management Note  Patient Details  Name: Terrence Beard MRN: [id number] Date of Birth: Mar 01, 1941  Subjective/Objective:    Pneumonia               Action/Plan: Patient lives at home alone, continues to drive; PCP is Dr Inda Merlin; has private insurance with Medicare; pharmacy of choice is [facility] on [address]; no DME at this time; no needs identified at this time; CM will continue to follow for progression of care.  Expected Discharge Date:     Possibly 11/09/2018             Expected Discharge Plan:  Home/Self Care  Discharge planning Services  CM Consult  Status of Service:  In process, will continue to follow  Sherrilyn Rist [phone] 11/05/2018, 10:56 AM

## 2018-11-05 NOTE — Care Management Important Message (Signed)
Important Message  Patient Details  Name: Terrence Beard MRN: [id number] Date of Birth: [birth year]   Medicare Important Message Given:  Yes    Barb Merino [place] 11/05/2018, 4:48 PM

## 2018-11-05 NOTE — Progress Notes (Signed)
EP service will follow up on echo out patient, though to be done/compledted prior to discharge.  (d/w Dr. Curt Bears) I have staff messaged Dr. Macky Lower scheduler to make follow up with him in the next 2-3 weeks. HR remains controlled by vitals DO NOT USE ANY POTENTIAL NODAL BLOCKING RATE LIMITING AGENTS FOR BP CONTROL  As per note from yesterday EP service remains available, please recall if needed.  Tommye Standard, PA-C

## 2018-11-05 NOTE — Progress Notes (Signed)
  Echocardiogram 2D Echocardiogram has been performed.  Terrence Beard 11/05/2018, 2:52 PM

## 2018-11-05 NOTE — Discharge Summary (Signed)
Physician Discharge Summary  Terrence Beard XHB:[id number] DOB: 11/30/1940 DOA: 11/03/2018  PCP: Josetta Huddle, MD  Admit date: 11/03/2018 Discharge date: 11/05/2018  Time spent: 35 minutes  Recommendations for Outpatient Follow-up:  1. PCP in 1 week, please check CXR in 6weeks 2. Cardiology Dr.Camnitz in 1 week   Discharge Diagnoses:  Principal Problem:   CAP (community acquired pneumonia)   Atrial Flutter   Hypothyroidism   CKD (chronic kidney disease)   Hyperglycemia   Atrial flutter ([place])   Hyponatremia   Discharge Condition: stable  Diet recommendation: heart healthy  Filed Weights   11/03/18 1614 11/03/18 2216 11/04/18 0538  Weight: 104.3 kg 105.4 kg 104.3 kg    History of present illness:  Terrence Beard a 78 y.o.malewith medical history significant ofRBBB/trifascicular block,DVT on anticoagulation, hypothyroidism,prediabetes,CKDstage III, and GERD; who presents withcomplaints of trouble breathing over the last 4 to 5 weeks. He has had a productive cough with intermittent chest tightness. It is hard for him to take deep breath in due to symptoms. Patient was evaluated by his primary care provider and just recently finished a Z-Pak 2 days ago.   Hospital Course:   Community-acquired pneumonia: -Admitted with productive cough congestion, low-grade fevers, chills -CXR noted possible early infection, started on ceftriaxone and zithromax - influenza PCR was negative -Clinically improving, Abx changed to oral cefdinir -Weaned off O2, ambulated with PT -discharged home in a stable condition on oral cefdinir  New onset atrial flutter -History of right bundle blanch block and trifascicular block -At baseline on Xarelto for prior PE -Was found to be in [place] on admission with controlled Ventricular rate -Continue Xarelto dose increased to 20 mg, suspect pneumonia could have triggered this -EP was consulted, recommended increasing Xarelto dose and  outpatient follow-up  Chronic diastolic CHF -Was given a single dose of Lasix on admission -Clinically appears euvolemic, last echocardiogram noted EF of 60 to 65% in 2014, repeat echo with EF of 60% and normal wall motion -clinically euvolemic and didn't need further diuretics, not on diuretics at home as well -discharged home without diuretics, on low salt diet, please assess volume status at FU  Transient hyperglycemia -CBG was 287 on admission, history of prediabetes -Hemoglobin A1c is 5.8 -does not need meds at DC  History of DVT/PE in 8/201 7 -Continue Xarelto, dose increased due to atrial flutter to 20 mg daily  Chronic kidney disease stage III:  -Patient presented with a creatinine of 1.44 with BUN 21,  previousbaseline creatinine to be around 1.30in 2017.  -Creatinine stable and improved now  Hypothyroidism -TSH within normal range -Continue levothyroxine  Consultants: Cardiology/EP  Discharge Exam: Vitals:   11/05/18 0818 11/05/18 1233  BP: 123/64 (!) 143/69  Pulse: (!) 56 (!) 51  Resp:  (!) 22  Temp:  98.1 F (36.7 C)  SpO2:  94%    General: AAOx3 Cardiovascular: S1S2/Irregular Respiratory: CTAB  Discharge Instructions   Discharge Instructions    Diet - low sodium heart healthy   Complete by:  As directed    Increase activity slowly   Complete by:  As directed      Allergies as of 11/05/2018      Reactions   Latex       Medication List    TAKE these medications   calcium carbonate 500 MG chewable tablet Commonly known as:  TUMS - dosed in mg elemental calcium Chew 2-3 tablets by mouth at bedtime as needed for indigestion or heartburn.   cefdinir  300 MG capsule Commonly known as:  OMNICEF Take 1 capsule (300 mg total) by mouth 2 (two) times daily for 4 days.   levothyroxine 50 MCG tablet Commonly known as:  SYNTHROID, LEVOTHROID Take 50 mcg by mouth daily before breakfast.   rivaroxaban 20 MG Tabs tablet Commonly known as:   XARELTO Take 1 tablet (20 mg total) by mouth daily with supper. What changed:    medication strength  how much to take  when to take this  Another medication with the same name was removed. Continue taking this medication, and follow the directions you see here.   Vitamin D 50 MCG (2000 UT) tablet Take 2,000 Units by mouth daily.      Allergies  Allergen Reactions  . Latex    Follow-up Information    Constance Haw, MD Follow up.   Specialty:  Cardiology Why:  You will be called by Dr. Macky Lower scheduler to make a 2-3 week follow up visit Contact information: [address] [place] [address] [phone]        Josetta Huddle, MD.   Specialty:  Internal Medicine Why:  Office will call patient Contact information: [address] [place] [address] [phone]            The results of significant diagnostics from this hospitalization (including imaging, microbiology, ancillary and laboratory) are listed below for reference.    Significant Diagnostic Studies: Dg Chest 2 View  Result Date: 11/03/2018 CLINICAL DATA:  Chest pressure and shortness of breath EXAM: CHEST - 2 VIEW COMPARISON:  11/13/2016 FINDINGS: Chronic cardiomegaly. Low volume chest with interstitial crowding and streaky density. Trace pleural effusions. No Kerley lines or air bronchogram. IMPRESSION: 1. Low volume chest with atelectasis or infection at the bases. A similar appearance was seen on 11/13/2016 chest x-ray. 2. Trace pleural effusions. Electronically Signed   By: Monte Fantasia M.D.   On: 11/03/2018 17:31    Microbiology: Recent Results (from the past 240 hour(s))  Culture, blood (routine x 2) Call MD if unable to obtain prior to antibiotics being given     Status: None (Preliminary result)   Collection Time: 11/03/18  6:55 PM  Result Value Ref Range Status   Specimen Description BLOOD RIGHT HAND  Final   Special Requests   Final    BOTTLES  DRAWN AEROBIC AND ANAEROBIC Blood Culture adequate volume   Culture   Final    NO GROWTH 2 DAYS Performed at [facility], [address] [place], [place] [address]    Report Status PENDING  Incomplete  Culture, blood (routine x 2) Call MD if unable to obtain prior to antibiotics being given     Status: Abnormal (Preliminary result)   Collection Time: 11/03/18  6:55 PM  Result Value Ref Range Status   Specimen Description BLOOD RIGHT ANTECUBITAL  Final   Special Requests   Final    BOTTLES DRAWN AEROBIC AND ANAEROBIC Blood Culture adequate volume Performed at [facility], [address], [place], [place] [address]    Culture  Setup Time   Final    AEROBIC BOTTLE ONLY GRAM POSITIVE COCCI CRITICAL RESULT CALLED TO, READ BACK BY AND VERIFIED WITH: [facility] PHARMD 11/04/18 2013 JDW    Culture (A)  Final    STAPHYLOCOCCUS SPECIES (COAGULASE NEGATIVE) THE SIGNIFICANCE OF ISOLATING THIS ORGANISM FROM A SINGLE SET OF BLOOD CULTURES WHEN MULTIPLE SETS ARE DRAWN IS UNCERTAIN. PLEASE NOTIFY THE MICROBIOLOGY  DEPARTMENT WITHIN ONE WEEK IF SPECIATION AND SENSITIVITIES ARE REQUIRED.    Report Status PENDING  Incomplete  Blood Culture ID Panel (Reflexed)     Status: Abnormal   Collection Time: 11/03/18  6:55 PM  Result Value Ref Range Status   Enterococcus species NOT DETECTED NOT DETECTED Final   Listeria monocytogenes NOT DETECTED NOT DETECTED Final   Staphylococcus species DETECTED (A) NOT DETECTED Final    Comment: Methicillin (oxacillin) susceptible coagulase negative staphylococcus. Possible blood culture contaminant (unless isolated from more than one blood culture draw or clinical case suggests pathogenicity). No antibiotic treatment is indicated for blood  culture contaminants. CRITICAL RESULT CALLED TO, READ BACK BY AND VERIFIED WITH: [facility] PHARMD 11/04/18 2010 JDW    Staphylococcus aureus (BCID) NOT DETECTED NOT DETECTED Final   Methicillin resistance NOT DETECTED NOT DETECTED  Final   Streptococcus species NOT DETECTED NOT DETECTED Final   Streptococcus agalactiae NOT DETECTED NOT DETECTED Final   Streptococcus pneumoniae NOT DETECTED NOT DETECTED Final   Streptococcus pyogenes NOT DETECTED NOT DETECTED Final   Acinetobacter baumannii NOT DETECTED NOT DETECTED Final   Enterobacteriaceae species NOT DETECTED NOT DETECTED Final   Enterobacter cloacae complex NOT DETECTED NOT DETECTED Final   Escherichia coli NOT DETECTED NOT DETECTED Final   Klebsiella oxytoca NOT DETECTED NOT DETECTED Final   Klebsiella pneumoniae NOT DETECTED NOT DETECTED Final   Proteus species NOT DETECTED NOT DETECTED Final   Serratia marcescens NOT DETECTED NOT DETECTED Final   Haemophilus influenzae NOT DETECTED NOT DETECTED Final   Neisseria meningitidis NOT DETECTED NOT DETECTED Final   Pseudomonas aeruginosa NOT DETECTED NOT DETECTED Final   Candida albicans NOT DETECTED NOT DETECTED Final   Candida glabrata NOT DETECTED NOT DETECTED Final   Candida krusei NOT DETECTED NOT DETECTED Final   Candida parapsilosis NOT DETECTED NOT DETECTED Final   Candida tropicalis NOT DETECTED NOT DETECTED Final    Comment: Performed at [facility], [place]. [address] [place], [place] [address]     Labs: Basic Metabolic Panel: Recent Labs  Lab 11/03/18 1636 11/03/18 2258 11/04/18 0357 11/05/18 0541  NA 132*  --  134* 133*  K 4.3  --  4.1 4.0  CL 101  --  102 99  CO2 20*  --  21* 24  GLUCOSE 287*  --  146* 133*  BUN 21  --  15 17  CREATININE 1.44*  --  1.36* 1.31*  CALCIUM 9.5  --  9.3 9.1  MG  --  2.2  --   --    Liver Function Tests: Recent Labs  Lab 11/03/18 1636  AST 28  ALT 52*  ALKPHOS 65  BILITOT 0.6  PROT 6.8  ALBUMIN 3.1*   No results for input(s): LIPASE, AMYLASE in the last 168 hours. No results for input(s): AMMONIA in the last 168 hours. CBC: Recent Labs  Lab 11/03/18 1618 11/04/18 0357 11/05/18 0541  WBC 15.5* 18.2* 13.3*  HGB 14.2 13.2 12.7*  HCT 43.4  40.9 39.4  MCV 91.0 89.9 90.0  PLT 444* 365 382   Cardiac Enzymes: Recent Labs  Lab 11/04/18 0357  TROPONINI <0.03   BNP: BNP (last 3 results) Recent Labs    11/03/18 1636  BNP 46.5    ProBNP (last 3 results) No results for input(s): PROBNP in the last 8760 hours.  CBG: Recent Labs  Lab 11/03/18 2242 11/04/18 2156  GLUCAP 146* 146*       Signed:  Domenic Polite MD.  [facility] 11/05/2018, 3:54 PM

## 2018-11-05 NOTE — Evaluation (Signed)
Physical Therapy Evaluation Patient Details Name: Terrence Beard MRN: [id number] DOB: [birth year] Today's Date: 11/05/2018   History of Present Illness  Terrence Beard is a 78 y.o. male with medical history significant of RBBB/trifascicular block, DVT on anticoagulation, hypothyroidism, prediabetes, CKDstage III, and GERD; admitted to [facility] with complaints of trouble breathing, CAP  Clinical Impression  Patient evaluated by Physical Therapy with no further acute PT needs identified. All education has been completed and the patient has no further questions.  Pt amb ~400' without LOB, denies dyspnea, SpO2 91% or above on RA; pt is anxious to go home today;  No further needs from PT standpoint; encouraged pt to work back up to his normal activity (he ususally amb ~ 65mi per day)  See below for any follow-up Physical Therapy or equipment needs. PT is signing off. Thank you for this referral.     Follow Up Recommendations No PT follow up    Equipment Recommendations  None recommended by PT    Recommendations for Other Services       Precautions / Restrictions Precautions Precautions: None Restrictions Weight Bearing Restrictions: No      Mobility  Bed Mobility Overal bed mobility: Modified Independent                Transfers Overall transfer level: Modified independent                  Ambulation/Gait Ambulation/Gait assistance: Modified independent (Device/Increase time)   Assistive device: None Gait Pattern/deviations: Step-through pattern;WFL(Within Functional Limits) Gait velocity: appears WFL; pt is community ambulator at baseline  400'    General Gait Details: pt with steady, fluid gait; no LOB, denies dyspnea; SpO2=/>91% on RA  Stairs            Wheelchair Mobility    Modified Rankin (Stroke Patients Only)       Balance     Sitting balance-Leahy Scale: Normal     Standing balance support: No upper extremity supported;During  functional activity Standing balance-Leahy Scale: Good Standing balance comment: not tested beyond moderate challenges and dynamic activities below;              High level balance activites: Direction changes;Turns;Head turns High Level Balance Comments: no LOB with above             Pertinent Vitals/Pain Pain Assessment: No/denies pain    Home Living Family/patient expects to be discharged to:: Private residence Living Arrangements: Alone   Type of Home: House           Additional Comments: walks at least 2 miles per day    Prior Function                 Hand Dominance        Extremity/Trunk Assessment   Upper Extremity Assessment Upper Extremity Assessment: Overall WFL for tasks assessed    Lower Extremity Assessment Lower Extremity Assessment: Overall WFL for tasks assessed       Communication      Cognition Arousal/Alertness: Awake/alert Behavior During Therapy: WFL for tasks assessed/performed Overall Cognitive Status: Within Functional Limits for tasks assessed                                        General Comments      Exercises     Assessment/Plan    PT Assessment Patent does not need  any further PT services  PT Problem List         PT Treatment Interventions      PT Goals (Current goals can be found in the Care Plan section)  Acute Rehab PT Goals Patient Stated Goal: home today PT Goal Formulation: All assessment and education complete, DC therapy    Frequency     Barriers to discharge        Co-evaluation               AM-PAC PT "6 Clicks" Mobility  Outcome Measure Help needed turning from your back to your side while in a flat bed without using bedrails?: None Help needed moving from lying on your back to sitting on the side of a flat bed without using bedrails?: None Help needed moving to and from a bed to a chair (including a wheelchair)?: None Help needed standing up from a chair  using your arms (e.g., wheelchair or bedside chair)?: None Help needed to walk in hospital room?: None Help needed climbing 3-5 steps with a railing? : None 6 Click Score: 24    End of Session Equipment Utilized During Treatment: Gait belt Activity Tolerance: Patient tolerated treatment well Patient left: Other (comment);with call bell/phone within reach(EOB) Nurse Communication: Mobility status PT Visit Diagnosis: Difficulty in walking, not elsewhere classified (R26.2)    Time: 1062-6948 PT Time Calculation (min) (ACUTE ONLY): 12 min   Charges:   PT Evaluation $PT Eval Low Complexity: 1 Low          Kenyon Ana, PT  Pager: [phone] Acute Rehab Dept [facility]): [phone]   11/05/2018   [facility] 11/05/2018, 11:05 AM

## 2018-11-05 NOTE — Consult Note (Signed)
            [facility] Primary Care Navigator  11/05/2018  Terrence Beard 1940/12/28 [id number]   Went to seepatient at the bedside to identify possible discharge needs buthe was alreadydischarged homeper staff.  Per MD note, patient presented withcomplaints of trouble breathing over past few weeks and had a productive cough with intermittent chest tightness and low grade fever (he was recently evaluated by his primary care provider and just recently finished antibiotic Z-Pak). His chest x-ray showed low lung volumes and question atelectasis versus infection. (community-acquired pneumonia, new onset atrial flutter)  Patient has discharge instruction to follow-up withprimary care provider in 1 week and cardiology follow-up in 1 week.  Primary care provider's office is listed as providing transition of care (TOC) follow-up.   For additional questions please contact:  Edwena Felty A. Anik Wesch, BSN, RN-BC [facility] PRIMARY CARE Navigator Cell: [phone]

## 2018-11-06 LAB — CULTURE, BLOOD (ROUTINE X 2): Special Requests: ADEQUATE

## 2018-11-06 LAB — LEGIONELLA PNEUMOPHILA SEROGP 1 UR AG: L. PNEUMOPHILA SEROGP 1 UR AG: NEGATIVE

## 2018-11-08 DIAGNOSIS — I4891 Unspecified atrial fibrillation: Secondary | ICD-10-CM | POA: Diagnosis not present

## 2018-11-08 DIAGNOSIS — N183 Chronic kidney disease, stage 3 (moderate): Secondary | ICD-10-CM | POA: Diagnosis not present

## 2018-11-08 DIAGNOSIS — J189 Pneumonia, unspecified organism: Secondary | ICD-10-CM | POA: Diagnosis not present

## 2018-11-08 LAB — CULTURE, BLOOD (ROUTINE X 2)
Culture: NO GROWTH
Special Requests: ADEQUATE

## 2018-11-10 DIAGNOSIS — R490 Dysphonia: Secondary | ICD-10-CM | POA: Diagnosis not present

## 2018-11-10 DIAGNOSIS — R7309 Other abnormal glucose: Secondary | ICD-10-CM | POA: Diagnosis not present

## 2018-11-10 DIAGNOSIS — R21 Rash and other nonspecific skin eruption: Secondary | ICD-10-CM | POA: Diagnosis not present

## 2018-11-10 DIAGNOSIS — J189 Pneumonia, unspecified organism: Secondary | ICD-10-CM | POA: Diagnosis not present

## 2018-11-10 DIAGNOSIS — I4891 Unspecified atrial fibrillation: Secondary | ICD-10-CM | POA: Diagnosis not present

## 2018-11-10 DIAGNOSIS — J9801 Acute bronchospasm: Secondary | ICD-10-CM | POA: Diagnosis not present

## 2018-11-10 DIAGNOSIS — N183 Chronic kidney disease, stage 3 (moderate): Secondary | ICD-10-CM | POA: Diagnosis not present

## 2018-11-16 ENCOUNTER — Ambulatory Visit (INDEPENDENT_AMBULATORY_CARE_PROVIDER_SITE_OTHER): Payer: Medicare Other | Admitting: Cardiology

## 2018-11-16 ENCOUNTER — Encounter: Payer: Self-pay | Admitting: Cardiology

## 2018-11-16 VITALS — BP 130/62 | HR 41 | Ht 71.0 in | Wt 227.0 lb

## 2018-11-16 DIAGNOSIS — I442 Atrioventricular block, complete: Secondary | ICD-10-CM | POA: Diagnosis not present

## 2018-11-16 DIAGNOSIS — I483 Typical atrial flutter: Secondary | ICD-10-CM | POA: Diagnosis not present

## 2018-11-16 NOTE — Progress Notes (Signed)
Electrophysiology Office Note   Date:  11/16/2018   ID:  Terrence Beard, Terrence Beard 31-Oct-1941, MRN [id number]  PCP:  Josetta Huddle, MD  Cardiologist:  Richardson Dopp Primary Electrophysiologist:  Rayetta Veith Meredith Leeds, MD    No chief complaint on file.    History of Present Illness: Terrence Beard is a 78 y.o. male who presents today for electrophysiology evaluation.   He has a history of long standing RBBB and was found to have trifascicular block on a recent ECG.  He has periods of mobitz I block as well as first degree AV block.  He is currently asymptomatic.  He has no dizziness but does endorse shortness of breath when walking up hills.  He is able to hunt and carry his guns and ammo through the woods without issues.  He had an event monitor showing [name] with a severely prolonged PR interval.  He recently had a pulmonary embolism and is currently taking is Xarelto. Since being put on his anticoagulation, he is felt well. He is trying to get back to exercising.  He presented to [facility] 11/03/2018 with shortness of breath.  He was found to have pneumonia as well as atrial flutter.  He was initially seen in the hospital and advised to wait until his pneumonia was treated prior to ablation.  Today, denies symptoms of palpitations, chest pain, PND, lower extremity edema, claudication, dizziness, presyncope, syncope, bleeding, or neurologic sequela. The patient is tolerating medications without difficulties.  Since his hospitalization, he has felt weak and fatigued.  He is also short of breath.  He is fortunately in sinus rhythm today, though he is in complete heart block.  He has found that he is not able to exercise as he previously could and is having some shortness of breath symptoms when he lays down.   Past Medical History:  Diagnosis Date  . Blood clot associated with vein wall inflammation    R. calf   . CKD (chronic kidney disease) stage 3, GFR 30-59 ml/min (HCC)    not  treated  . DJD (degenerative joint disease)   . Dysphagia   . Erectile dysfunction   . GERD (gastroesophageal reflux disease)   . Glucose intolerance (impaired glucose tolerance)   . History of echocardiogram    a. Echo 7/14:  EF 60-65%, mod asymmetric LVH, Mild LAE, mild to mod MR,   . History of nuclear stress test    a. ETT-Myoview 7/14:  EF 71%, normal perfusion, low risk  . Hypothyroidism   . Multiple lipomas   . Nasal polyposis   . Obesity   . RBBB   . Seasonal allergies    Past Surgical History:  Procedure Laterality Date  . COLONOSCOPY WITH PROPOFOL N/A 02/05/2016   Procedure: COLONOSCOPY WITH PROPOFOL;  Surgeon: Garlan Fair, MD;  Location: [facility] ENDOSCOPY;  Service: Endoscopy;  Laterality: N/A;  . LYMPH NODE BIOPSY    . TONSILLECTOMY    . TOOTH EXTRACTION  10/07/2016     Current Outpatient Medications  Medication Sig Dispense Refill  . calcium carbonate (TUMS - DOSED IN MG ELEMENTAL CALCIUM) 500 MG chewable tablet Chew 2-3 tablets by mouth at bedtime as needed for indigestion or heartburn.    . Cholecalciferol (VITAMIN D) 2000 UNITS tablet Take 2,000 Units by mouth daily.     Marland Kitchen levothyroxine (SYNTHROID, LEVOTHROID) 50 MCG tablet Take 50 mcg by mouth daily before breakfast.    . rivaroxaban (XARELTO) 20 MG TABS tablet Take 1  tablet (20 mg total) by mouth daily with supper. 30 tablet 0   No current facility-administered medications for this visit.     Allergies:   Latex   Social History:  The patient  reports that he has never smoked. He has never used smokeless tobacco. He reports current alcohol use. He reports that he does not use drugs.   Family History:  The patient's family history includes Diabetes in his maternal grandmother; GER disease in his father; Kidney disease in his mother.    ROS:  Please see the history of present illness.   Otherwise, review of systems is positive for chills, fever, chest pressure, cough, shortness of breath, wheezing.   All other  systems are reviewed and negative.   PHYSICAL EXAM: VS:  BP 130/62   Pulse (!) 41   Ht 5\' 11"  (1.803 m)   Wt 227 lb (103 kg)   BMI 31.66 kg/m  , BMI Body mass index is 31.66 kg/m. GEN: Well nourished, well developed, in no acute distress  HEENT: normal  Neck: no JVD, carotid bruits, or masses Cardiac: Bradycardic, regular; no murmurs, rubs, or gallops,no edema  Respiratory:  clear to auscultation bilaterally, normal work of breathing GI: soft, nontender, nondistended, + BS MS: no deformity or atrophy  Skin: warm and dry Neuro:  Strength and sensation are intact Psych: euthymic mood, full affect  EKG:  EKG is ordered today. Personal review of the ekg ordered shows sinus rhythm, complete heart block, rate 41, junctional escape  Recent Labs: 11/03/2018: ALT 52; B Natriuretic Peptide 46.5; Magnesium 2.2; TSH 3.422 11/05/2018: BUN 17; Creatinine, Ser 1.31; Hemoglobin 12.7; Platelets 382; Potassium 4.0; Sodium 133    Lipid Panel  No results found for: CHOL, TRIG, HDL, CHOLHDL, VLDL, LDLCALC, LDLDIRECT   Wt Readings from Last 3 Encounters:  11/16/18 227 lb (103 kg)  11/04/18 230 lb (104.3 kg)  03/18/18 234 lb (106.1 kg)      Other studies Reviewed: Additional studies/ records that were reviewed today include: 48 hour monitor  Review of the above records today demonstrates:  Mean heart rate 67 bpm Maximum heart rate 109 bpm Minimum heart rate 48 bpm Ventricular beats 497 (0.32%) Supraventricular beats 542 (0.3%) Sinus rhythm with first degree AV block Intermittent Mobitz I block     TTE 11/05/17 - Left ventricle: The cavity size was normal. There was moderate   focal basal hypertrophy of the septum. Systolic function was   vigorous. The estimated ejection fraction was in the range of 65%   to 70%. Wall motion was normal; there were no regional wall   motion abnormalities. The study is not technically sufficient to   allow evaluation of LV diastolic function. - Aortic  valve: Trileaflet; mildly thickened, mildly calcified   leaflets. - Mitral valve: Calcified annulus. - Right atrium: The atrium was mildly dilated. - Pericardium, extracardiac: A trivial pericardial effusion was   identified.   ASSESSMENT AND PLAN:  1.  Complete heart block: Currently on no medications that would cause his complete heart block.  He does have weakness, fatigue, and shortness of breath.  Due to that, we Nealie Mchatton plan for pacemaker implant.  Risks and benefits were discussed include bleeding, tamponade, heart block, stroke.  2. Pulmonary embolism: Continue Xarelto per primary physician   3.  Typical appearing atrial flutter: Heard during a pneumonia.  He has had no recurrences.  In sinus rhythm today.  This patients CHA2DS2-VASc Score and unadjusted Ischemic Stroke Rate (% per year)  is equal to 2.2 % stroke rate/year from a score of 2  Above score calculated as 1 point each if present [CHF, HTN, DM, Vascular=MI/PAD/Aortic Plaque, Age if 65-74, or Male] Above score calculated as 2 points each if present [Age > 75, or Stroke/TIA/TE]   Current medicines are reviewed at length with the patient today.   The patient does not have concerns regarding his medicines.  The following changes were made today:  none  Labs/ tests ordered today include:  Orders Placed This Encounter  Procedures  . EKG 12-Lead     Disposition:   FU with Dreyah Montrose 3 months  Signed, Sherissa Tenenbaum Meredith Leeds, MD  11/16/2018 1:41 PM     [place] [phone] (office) [phone] (fax)

## 2018-11-16 NOTE — Patient Instructions (Addendum)
Medication Instructions:  Your physician recommends that you continue on your current medications as directed. Please refer to the Current Medication list given to you today.  * If you need a refill on your cardiac medications before your next appointment, please call your pharmacy.   Labwork: None ordered  Testing/Procedures: Your physician has recommended that you have a pacemaker inserted. A pacemaker is a small device that is placed under the skin of your chest or abdomen to help control abnormal heart rhythms. This device uses electrical pulses to prompt the heart to beat at a normal rate. Pacemakers are used to treat heart rhythms that are too slow. Wire (leads) are attached to the pacemaker that goes into the chambers of you heart. This is done in the hospital and usually requires and overnight stay. Please see the instructions below located under special instructions area.  The following dates are available (these are subject to change):  2/6, 2/7, 2/12, 2/13, 2/27, 2/28   Follow-Up: Your physician recommends that you schedule a follow-up appointment in: 10-14 days, after your procedure on _______, with device clinic for a wound check.  Your physician recommends that you schedule a follow-up appointment in: 91 days, after your procedure on _______, with Dr. Curt Bears.   Thank you for choosing [facility]!!   Trinidad Curet, RN [phone]  Any Other Special Instructions Will Be Listed Below (If Applicable).     Implantable Device Instructions  You are scheduled for:                  _____ Permanent Pacemaker  on  _____  with Dr. Curt Bears.  1.   Please arrive at the [facility], Entrance "A"  at [facility] at  ________ on the day of your procedure. (The address is [address]  2. Do not eat or drink after midnight the night before your procedure.  3.   Complete pre procedure  lab work on ______.  The lab at [facility] [address] is open from  8:00 AM to 4:30 PM.  You do not have to be fasting.  4.   Medication instructions:  ____________________  5.  Plan for an overnight stay.  Bring your insurance cards and a list of you medications.  6.  Wash your chest and neck with surgical scrub the evening before and the morning of  your procedure.  Rinse well. Please review the surgical scrub instruction sheet given to you.  7. Your chest will need to be shaved prior to this procedure (if needed). We ask that you do this yourself at home 1 to 2 days before or if uncomfortable/unable to do yourself, then it will be performed by the hospital staff the day of.                                                                                                                * If you have ANY questions after you get home, please call Trinidad Curet, RN @ [phone])  [phone].  * Every attempt is made to prevent procedures from being rescheduled.  Due to the nature of  Electrophysiology, rescheduling can happen.  The physician is always aware and directs the staff when this occurs.   Pacemaker Implantation, Adult Pacemaker implantation is a procedure to place a pacemaker inside your chest. A pacemaker is a small computer that sends electrical signals to the heart and helps your heart beat normally. A pacemaker also stores information about your heart rhythms. You may need pacemaker implantation if you:  Have a slow heartbeat (bradycardia).  Faint (syncope).  Have shortness of breath (dyspnea) due to heart problems. The pacemaker attaches to your heart through a wire, called a lead. Sometimes just one lead is needed. Other times, there will be two leads. There are two types of pacemakers:  Transvenous pacemaker. This type is placed under the skin or muscle of your chest. The lead goes through a vein in the chest area to reach the inside of the heart.  Epicardial pacemaker. This type is placed under the skin or muscle of your chest or belly. The lead goes  through your chest to the outside of the heart. Tell a health care provider about:  Any allergies you have.  All medicines you are taking, including vitamins, herbs, eye drops, creams, and over-the-counter medicines.  Any problems you or family members have had with anesthetic medicines.  Any blood or bone disorders you have.  Any surgeries you have had.  Any medical conditions you have.  Whether you are pregnant or may be pregnant. What are the risks? Generally, this is a safe procedure. However, problems may occur, including:  Infection.  Bleeding.  Failure of the pacemaker or the lead.  Collapse of a lung or bleeding into a lung.  Blood clot inside a blood vessel with a lead.  Damage to the heart.  Infection inside the heart (endocarditis).  Allergic reactions to medicines. What happens before the procedure? Staying hydrated Follow instructions from your health care provider about hydration, which may include:  Up to 2 hours before the procedure - you may continue to drink clear liquids, such as water, clear fruit juice, black coffee, and plain tea. Eating and drinking restrictions Follow instructions from your health care provider about eating and drinking, which may include:  8 hours before the procedure - stop eating heavy meals or foods such as meat, fried foods, or fatty foods.  6 hours before the procedure - stop eating light meals or foods, such as toast or cereal.  6 hours before the procedure - stop drinking milk or drinks that contain milk.  2 hours before the procedure - stop drinking clear liquids. Medicines  Ask your health care provider about: ? Changing or stopping your regular medicines. This is especially important if you are taking diabetes medicines or blood thinners. ? Taking medicines such as aspirin and ibuprofen. These medicines can thin your blood. Do not take these medicines before your procedure if your health care provider instructs  you not to.  You may be given antibiotic medicine to help prevent infection. General instructions  You will have a heart evaluation. This may include an electrocardiogram (ECG), chest X-ray, and heart imaging (echocardiogram,  or echo) tests.  You will have blood tests.  Do not use any products that contain nicotine or tobacco, such as cigarettes and e-cigarettes. If you need help quitting, ask your health care provider.  Plan to have someone take you home from the hospital or  clinic.  If you will be going home right after the procedure, plan to have someone with you for 24 hours.  Ask your health care provider how your surgical site will be marked or identified. What happens during the procedure?  To reduce your risk of infection: ? Your health care team will wash or sanitize their hands. ? Your skin will be washed with soap. ? Hair may be removed from the surgical area.  An IV tube will be inserted into one of your veins.  You will be given one or more of the following: ? A medicine to help you relax (sedative). ? A medicine to numb the area (local anesthetic). ? A medicine to make you fall asleep (general anesthetic).  If you are getting a transvenous pacemaker: ? An incision will be made in your upper chest. ? A pocket will be made for the pacemaker. It may be placed under the skin or between layers of muscle. ? The lead will be inserted into a blood vessel that returns to the heart. ? While X-rays are taken by an imaging machine (fluoroscopy), the lead will be advanced through the vein to the inside of your heart. ? The other end of the lead will be tunneled under the skin and attached to the pacemaker.  If you are getting an epicardial pacemaker: ? An incision will be made near your ribs or breastbone (sternum) for the lead. ? The lead will be attached to the outside of your heart. ? Another incision will be made in your chest or upper belly to create a pocket for the  pacemaker. ? The free end of the lead will be tunneled under the skin and attached to the pacemaker.  The transvenous or epicardial pacemaker will be tested. Imaging studies may be done to check the lead position.  The incisions will be closed with stitches (sutures), adhesive strips, or skin glue.  Bandages (dressing) will be placed over the incisions. The procedure may vary among health care providers and hospitals. What happens after the procedure?  Your blood pressure, heart rate, breathing rate, and blood oxygen level will be monitored until the medicines you were given have worn off.  You will be given antibiotics and pain medicine.  ECG and chest x-rays will be done.  You will wear a continuous type of ECG (Holter monitor) to check your heart rhythm.  Your health care provider will program the pacemaker.  Do not drive for 24 hours if you received a sedative. This information is not intended to replace advice given to you by your health care provider. Make sure you discuss any questions you have with your health care provider. Document Released: 10/10/2002 Document Revised: 07/09/2018 Document Reviewed: 04/02/2016 Elsevier Interactive Patient Education  2019 [employer].

## 2018-11-24 DIAGNOSIS — J342 Deviated nasal septum: Secondary | ICD-10-CM | POA: Diagnosis not present

## 2018-11-24 DIAGNOSIS — Z9089 Acquired absence of other organs: Secondary | ICD-10-CM | POA: Diagnosis not present

## 2018-11-24 DIAGNOSIS — K219 Gastro-esophageal reflux disease without esophagitis: Secondary | ICD-10-CM | POA: Diagnosis not present

## 2018-12-08 DIAGNOSIS — R21 Rash and other nonspecific skin eruption: Secondary | ICD-10-CM | POA: Diagnosis not present

## 2018-12-08 DIAGNOSIS — R7309 Other abnormal glucose: Secondary | ICD-10-CM | POA: Diagnosis not present

## 2018-12-08 DIAGNOSIS — I4891 Unspecified atrial fibrillation: Secondary | ICD-10-CM | POA: Diagnosis not present

## 2018-12-08 DIAGNOSIS — N183 Chronic kidney disease, stage 3 (moderate): Secondary | ICD-10-CM | POA: Diagnosis not present

## 2018-12-08 DIAGNOSIS — R001 Bradycardia, unspecified: Secondary | ICD-10-CM | POA: Diagnosis not present

## 2018-12-08 DIAGNOSIS — J189 Pneumonia, unspecified organism: Secondary | ICD-10-CM | POA: Diagnosis not present

## 2018-12-08 DIAGNOSIS — R490 Dysphonia: Secondary | ICD-10-CM | POA: Diagnosis not present

## 2018-12-08 DIAGNOSIS — J9801 Acute bronchospasm: Secondary | ICD-10-CM | POA: Diagnosis not present

## 2018-12-08 DIAGNOSIS — D6851 Activated protein C resistance: Secondary | ICD-10-CM | POA: Diagnosis not present

## 2018-12-08 DIAGNOSIS — I442 Atrioventricular block, complete: Secondary | ICD-10-CM | POA: Diagnosis not present

## 2018-12-08 DIAGNOSIS — E039 Hypothyroidism, unspecified: Secondary | ICD-10-CM | POA: Diagnosis not present

## 2019-04-05 DIAGNOSIS — L570 Actinic keratosis: Secondary | ICD-10-CM | POA: Diagnosis not present

## 2019-04-05 DIAGNOSIS — Z85828 Personal history of other malignant neoplasm of skin: Secondary | ICD-10-CM | POA: Diagnosis not present

## 2019-04-05 DIAGNOSIS — B351 Tinea unguium: Secondary | ICD-10-CM | POA: Diagnosis not present

## 2019-04-05 DIAGNOSIS — L821 Other seborrheic keratosis: Secondary | ICD-10-CM | POA: Diagnosis not present

## 2019-04-05 DIAGNOSIS — L218 Other seborrheic dermatitis: Secondary | ICD-10-CM | POA: Diagnosis not present

## 2019-04-05 DIAGNOSIS — L812 Freckles: Secondary | ICD-10-CM | POA: Diagnosis not present

## 2019-04-05 DIAGNOSIS — D1801 Hemangioma of skin and subcutaneous tissue: Secondary | ICD-10-CM | POA: Diagnosis not present

## 2019-07-12 DIAGNOSIS — Z125 Encounter for screening for malignant neoplasm of prostate: Secondary | ICD-10-CM | POA: Diagnosis not present

## 2019-07-12 DIAGNOSIS — E039 Hypothyroidism, unspecified: Secondary | ICD-10-CM | POA: Diagnosis not present

## 2019-07-12 DIAGNOSIS — Z23 Encounter for immunization: Secondary | ICD-10-CM | POA: Diagnosis not present

## 2019-07-12 DIAGNOSIS — Z79899 Other long term (current) drug therapy: Secondary | ICD-10-CM | POA: Diagnosis not present

## 2019-07-12 DIAGNOSIS — I4891 Unspecified atrial fibrillation: Secondary | ICD-10-CM | POA: Diagnosis not present

## 2019-07-12 DIAGNOSIS — N183 Chronic kidney disease, stage 3 (moderate): Secondary | ICD-10-CM | POA: Diagnosis not present

## 2019-07-12 DIAGNOSIS — Z0000 Encounter for general adult medical examination without abnormal findings: Secondary | ICD-10-CM | POA: Diagnosis not present

## 2019-07-12 DIAGNOSIS — R001 Bradycardia, unspecified: Secondary | ICD-10-CM | POA: Diagnosis not present

## 2019-07-12 DIAGNOSIS — K639 Disease of intestine, unspecified: Secondary | ICD-10-CM | POA: Diagnosis not present

## 2019-07-12 DIAGNOSIS — Z135 Encounter for screening for eye and ear disorders: Secondary | ICD-10-CM | POA: Diagnosis not present

## 2019-07-12 DIAGNOSIS — I442 Atrioventricular block, complete: Secondary | ICD-10-CM | POA: Diagnosis not present

## 2019-07-12 DIAGNOSIS — D6851 Activated protein C resistance: Secondary | ICD-10-CM | POA: Diagnosis not present

## 2019-07-12 DIAGNOSIS — Z1389 Encounter for screening for other disorder: Secondary | ICD-10-CM | POA: Diagnosis not present

## 2019-07-12 DIAGNOSIS — R7309 Other abnormal glucose: Secondary | ICD-10-CM | POA: Diagnosis not present

## 2019-07-12 DIAGNOSIS — E559 Vitamin D deficiency, unspecified: Secondary | ICD-10-CM | POA: Diagnosis not present

## 2019-07-15 ENCOUNTER — Telehealth: Payer: Self-pay | Admitting: *Deleted

## 2019-07-15 NOTE — Telephone Encounter (Signed)
Followed up w/ pt after several unsuccessful attempts to get PPM implant arranged.  Pt reports that he recently was seen by PCP who recommended he get a PPM. Pt scheduled for 9/29 to discuss further w/ Dr. Curt Bears. Patient verbalized understanding and agreeable to plan.

## 2019-08-01 DIAGNOSIS — R140 Abdominal distension (gaseous): Secondary | ICD-10-CM | POA: Diagnosis not present

## 2019-08-02 ENCOUNTER — Ambulatory Visit (INDEPENDENT_AMBULATORY_CARE_PROVIDER_SITE_OTHER): Payer: Medicare Other | Admitting: Cardiology

## 2019-08-02 ENCOUNTER — Encounter: Payer: Self-pay | Admitting: Cardiology

## 2019-08-02 ENCOUNTER — Other Ambulatory Visit: Payer: Self-pay

## 2019-08-02 VITALS — BP 164/74 | HR 52 | Ht 71.0 in | Wt 238.0 lb

## 2019-08-02 DIAGNOSIS — I442 Atrioventricular block, complete: Secondary | ICD-10-CM

## 2019-08-02 NOTE — Progress Notes (Signed)
Electrophysiology Office Note   Date:  08/02/2019   ID:  Terrence, Beard [birth year], MRN [id number]  PCP:  Josetta Huddle, MD  Cardiologist:  Richardson Dopp Primary Electrophysiologist:  Silvia Markuson Meredith Leeds, MD    No chief complaint on file.    History of Present Illness: Terrence Beard is a 78 y.o. male who presents today for electrophysiology evaluation.   He has a history of long standing RBBB and was found to have trifascicular block on a recent ECG.  He has periods of mobitz I block as well as first degree AV block.  He is currently asymptomatic.  He has no dizziness but does endorse shortness of breath when walking up hills.  He is able to hunt and carry his guns and ammo through the woods without issues.  He had an event monitor showing [name] with a severely prolonged PR interval.  He recently had a pulmonary embolism and is currently taking is Xarelto. Since being put on his anticoagulation, he is felt well. He is trying to get back to exercising.  He presented to [facility]/03/2018 with shortness of breath.  He was found to have pneumonia as well as atrial flutter.  He was initially seen in the hospital and advised to wait until his pneumonia was treated prior to ablation.  Today, denies symptoms of palpitations, chest pain, shortness of breath, orthopnea, PND, lower extremity edema, claudication, dizziness, presyncope, syncope, bleeding, or neurologic sequela. The patient is tolerating medications without difficulties.  Continues to have weakness, fatigue, and shortness of breath.  This occurs mainly when he exerts himself.  He states that he has been putting off a pacemaker for many years, but now feels that it may be the right thing to do.   Past Medical History:  Diagnosis Date  . Blood clot associated with vein wall inflammation    R. calf   . CKD (chronic kidney disease) stage 3, GFR 30-59 ml/min (HCC)    not treated  . DJD (degenerative joint disease)    . Dysphagia   . Erectile dysfunction   . GERD (gastroesophageal reflux disease)   . Glucose intolerance (impaired glucose tolerance)   . History of echocardiogram    a. Echo 7/14:  EF 60-65%, mod asymmetric LVH, Mild LAE, mild to mod MR,   . History of nuclear stress test    a. ETT-Myoview 7/14:  EF 71%, normal perfusion, low risk  . Hypothyroidism   . Multiple lipomas   . Nasal polyposis   . Obesity   . RBBB   . Seasonal allergies    Past Surgical History:  Procedure Laterality Date  . COLONOSCOPY WITH PROPOFOL N/A 02/05/2016   Procedure: COLONOSCOPY WITH PROPOFOL;  Surgeon: Garlan Fair, MD;  Location: [facility];  Service: Endoscopy;  Laterality: N/A;  . LYMPH NODE BIOPSY    . TONSILLECTOMY    . TOOTH EXTRACTION  10/07/2016     Current Outpatient Medications  Medication Sig Dispense Refill  . calcium carbonate (TUMS - DOSED IN MG ELEMENTAL CALCIUM) 500 MG chewable tablet Chew 2-3 tablets by mouth at bedtime as needed for indigestion or heartburn.    . Cholecalciferol (VITAMIN D) 2000 UNITS tablet Take 1,000 Units by mouth every other day.     . levothyroxine (SYNTHROID, LEVOTHROID) 50 MCG tablet Take 50 mcg by mouth daily before breakfast.    . rivaroxaban (XARELTO) 20 MG TABS tablet Take 1 tablet (20 mg total) by mouth daily with supper.  30 tablet 0   No current facility-administered medications for this visit.     Allergies:   Latex   Social History:  The patient  reports that he has never smoked. He has never used smokeless tobacco. He reports current alcohol use. He reports that he does not use drugs.   Family History:  The patient's family history includes Diabetes in his maternal grandmother; GER disease in his father; Kidney disease in his mother.    ROS:  Please see the history of present illness.   Otherwise, review of systems is positive for none.   All other systems are reviewed and negative.   PHYSICAL EXAM: VS:  BP (!) 164/74   Pulse (!) 52   Ht 5'  11" (1.803 m)   Wt 238 lb (108 kg)   SpO2 96%   BMI 33.19 kg/m  , BMI Body mass index is 33.19 kg/m. GEN: Well nourished, well developed, in no acute distress  HEENT: normal  Neck: no JVD, carotid bruits, or masses Cardiac: RRR; no murmurs, rubs, or gallops,no edema  Respiratory:  clear to auscultation bilaterally, normal work of breathing GI: soft, nontender, nondistended, + BS MS: no deformity or atrophy  Skin: warm and dry Neuro:  Strength and sensation are intact Psych: euthymic mood, full affect  EKG:  EKG is ordered today. Personal review of the ekg ordered shows sinus rhythm, complete heart block, PVCs, right bundle branch block, left anterior fascicular block  Recent Labs: 11/03/2018: ALT 52; B Natriuretic Peptide 46.5; Magnesium 2.2; TSH 3.422 11/05/2018: BUN 17; Creatinine, Ser 1.31; Hemoglobin 12.7; Platelets 382; Potassium 4.0; Sodium 133    Lipid Panel  No results found for: CHOL, TRIG, HDL, CHOLHDL, VLDL, LDLCALC, LDLDIRECT   Wt Readings from Last 3 Encounters:  08/02/19 238 lb (108 kg)  11/16/18 227 lb (103 kg)  11/04/18 230 lb (104.3 kg)      Other studies Reviewed: Additional studies/ records that were reviewed today include: 48 hour monitor  Review of the above records today demonstrates:  Mean heart rate 67 bpm Maximum heart rate 109 bpm Minimum heart rate 48 bpm Ventricular beats 497 (0.32%) Supraventricular beats 542 (0.3%) Sinus rhythm with first degree AV block Intermittent Mobitz I block     TTE 11/05/17 - Left ventricle: The cavity size was normal. There was moderate   focal basal hypertrophy of the septum. Systolic function was   vigorous. The estimated ejection fraction was in the range of 65%   to 70%. Wall motion was normal; there were no regional wall   motion abnormalities. The study is not technically sufficient to   allow evaluation of LV diastolic function. - Aortic valve: Trileaflet; mildly thickened, mildly calcified   leaflets.  - Mitral valve: Calcified annulus. - Right atrium: The atrium was mildly dilated. - Pericardium, extracardiac: A trivial pericardial effusion was   identified.   ASSESSMENT AND PLAN:  1.  Complete heart block: Currently on no medications that causes complete heart block.  He has weakness, fatigue, shortness of breath.  We Sugey Trevathan plan for pacemaker implant.  Risks and benefits were discussed include bleeding, tamponade, heart block, stroke.  The patient understands these risks and is agreed to the procedure.  2. Pulmonary embolism: Plan per primary physician  3.  Typical appearing atrial flutter: Occurred during a pneumonia.  No recurrences.  This patients CHA2DS2-VASc Score and unadjusted Ischemic Stroke Rate (% per year) is equal to 3.2 % stroke rate/year from a score of 3  Above score calculated as 1 point each if present [CHF, HTN, DM, Vascular=MI/PAD/Aortic Plaque, Age if 65-74, or Male] Above score calculated as 2 points each if present [Age > 75, or Stroke/TIA/TE]   Current medicines are reviewed at length with the patient today.   The patient does not have concerns regarding his medicines.  The following changes were made today:  none  Labs/ tests ordered today include:  Orders Placed This Encounter  Procedures  . EKG 12-Lead     Disposition:   FU with Erryn Dickison 43months  Signed, Nestor Wieneke Meredith Leeds, MD  08/02/2019 4:35 PM     [address] [place] [id number] [phone] (office) [phone] (fax)

## 2019-08-02 NOTE — Patient Instructions (Signed)
Medication Instructions:  Your physician recommends that you continue on your current medications as directed. Please refer to the Current Medication list given to you today.     * If you need a refill on your cardiac medications before your next appointment, please call your pharmacy. *   Labwork: Pre procedure lab work today: BMET & CBC w/ diff Your physician recommends that you return for pre procedure lab work in: ________  * Will notify you of abnormal results, otherwise continue current treatment plan.*   Testing/Procedures: Your physician has recommended that you have a pacemaker inserted. A pacemaker is a small device that is placed under the skin of your chest or abdomen to help control abnormal heart rhythms. This device uses electrical pulses to prompt the heart to beat at a normal rate. Pacemakers are used to treat heart rhythms that are too slow. Wire (leads) are attached to the pacemaker that goes into the chambers of you heart. This is done in the hospital and usually requires and overnight stay. Please follow the instructions below, located under the special instructions section.   Follow-Up: Your physician recommends that you schedule a wound check appointment 10-14 days, after your procedure on __________, with the device clinic.  Your physician recommends that you schedule a follow up appointment in 91 days, after your procedure on ____________, with Dr. Curt Bears.  * Please note that any paperwork needing to be filled out by the provider will need to be addressed at the front desk prior to seeing the provider.  Please note that any FMLA, disability or other documents regarding health condition is subject to a $25.00 charge that must be received prior to completion of paperwork in the form of a money order or check. *  Thank you for choosing [facility]!!   Trinidad Curet, RN [phone]   Any Other Special Instructions Will Be Listed Below (If Applicable).      Implantable Device Instructions  You are scheduled for: Pacemaker implant on 09/07/19 with Dr. Curt Bears.  1.   Pre procedure testing-             A.  LAB WORK--- On ________   You do not need to be fasting.                B. COVID TEST-- On 09/03/19 @ 11:00 am - You will go to [facility] hospital ([place]) for your Covid testing.   This is a drive thru test site.  There will be multiple testing areas.  Be sure to share with the first checkpoint that you are there for pre-procedure/surgery testing. This will put you into the right (yellow) lane that leads to the PAT testing team.   Stay in your car and the nurse team will come to your car to test you.  After you are tested please go home and self quarantine until the day of your procedure.    2. On the day of your procedure 09/07/19 you will go to [facility] hospital ([address]) at 9:30 am.  Dennis Bast will go to the main entrance [address] [employer]) and enter where the [employer] are.  You will check in at ADMITTING.  You may have one support person come in to the hospital with you.  They will be asked to wait in the waiting room.   3.   Do not eat or drink after midnight prior to your procedure.   4.  On the morning of your procedure do NOT take any medication.  5.  The night before your procedure and the morning of your procedure scrub your neck/chest with surgical scrub.  An instruction letter is included with this letter.   5.  Plan for an overnight stay.  If you use your phone frequently bring your phone charger.  When you are discharged you will need someone to drive you home.   6.  You will follow up with the [place] clinic 10-14 days after your procedure. You will follow up with Dr. Curt Bears 91 days after your procedure.  These appointments will be made for you.   * If you have ANY questions after you get home, please call the office [phone] and ask for Merci Walthers RN or send a MyChart message.    [place] - Preparing For Surgery  Before surgery, you can play an important role. Because skin is not sterile, your skin needs to be as free of germs as possible. You can reduce the number of germs on your skin by washing with CHG (chlorahexidine gluconate) Soap before surgery.  CHG is an antiseptic cleaner which kills germs and bonds with the skin to continue killing germs even after washing.   Please do not use if you have an allergy to CHG or antibacterial soaps.  If your skin becomes reddened/irritated stop using the CHG.   Do not shave (including legs and underarms) for at least 48 hours prior to first CHG shower.  It is OK to shave your face.  Please follow these instructions carefully:  1.  Shower the night before surgery and the morning of surgery with CHG.  2.  If you choose to wash your hair, wash your hair first as usual with your normal shampoo.  3.  After you shampoo, rinse your hair and body thoroughly to remove the shampoo.  4.  Use CHG as you would any other liquid soap.  You can apply CHG directly to the skin and wash gently with a clean washcloth. 5.  Apply the CHG Soap to your body ONLY FROM THE NECK DOWN.  Do not use on open wounds or open sores.  Avoid contact with your eyes, ears, mouth and genitals (private parts).  Wash genitals (private parts) with your normal soap.  6.  Wash thoroughly, paying special attention to the area where your surgery will be performed.  7.  Thoroughly rinse your body with warm water from the neck down.   8.  DO NOT shower/wash with your normal soap after using and rinsing off the CHG soap.  9.  Pat yourself dry with a clean towel.           10.  Wear clean pajamas.           11.  Place clean sheets on your bed the night of your first shower and do not sleep with pets.  Day of Surgery: Do not apply any deodorants/lotions.  Please wear clean clothes to the hospital/surgery center.     Pacemaker Implantation, Adult Pacemaker implantation  is a procedure to place a pacemaker inside your chest. A pacemaker is a small computer that sends electrical signals to the heart and helps your heart beat normally. A pacemaker also stores information about your heart rhythms. You may need pacemaker implantation if you:  Have a slow heartbeat (bradycardia).  Faint (syncope).  Have shortness of breath (dyspnea) due to heart problems.  The pacemaker attaches to your  heart through a wire, called a lead. Sometimes just one lead is needed. Other times, there will be two leads. There are two types of pacemakers:  Transvenous pacemaker. This type is placed under the skin or muscle of your chest. The lead goes through a vein in the chest area to reach the inside of the heart.  Epicardial pacemaker. This type is placed under the skin or muscle of your chest or belly. The lead goes through your chest to the outside of the heart.  Tell a health care provider about:  Any allergies you have.  All medicines you are taking, including vitamins, herbs, eye drops, creams, and over-the-counter medicines.  Any problems you or family members have had with anesthetic medicines.  Any blood or bone disorders you have.  Any surgeries you have had.  Any medical conditions you have.  Whether you are pregnant or may be pregnant. What are the risks? Generally, this is a safe procedure. However, problems may occur, including:  Infection.  Bleeding.  Failure of the pacemaker or the lead.  Collapse of a lung or bleeding into a lung.  Blood clot inside a blood vessel with a lead.  Damage to the heart.  Infection inside the heart (endocarditis).  Allergic reactions to medicines.  What happens before the procedure? Staying hydrated Follow instructions from your health care provider about hydration, which may include:  Up to 2 hours before the procedure - you may continue to drink clear liquids, such as water, clear fruit juice, black coffee, and  plain tea.  Eating and drinking restrictions Follow instructions from your health care provider about eating and drinking, which may include:  8 hours before the procedure - stop eating heavy meals or foods such as meat, fried foods, or fatty foods.  6 hours before the procedure - stop eating light meals or foods, such as toast or cereal.  6 hours before the procedure - stop drinking milk or drinks that contain milk.  2 hours before the procedure - stop drinking clear liquids.  Medicines  Ask your health care provider about: ? Changing or stopping your regular medicines. This is especially important if you are taking diabetes medicines or blood thinners. ? Taking medicines such as aspirin and ibuprofen. These medicines can thin your blood. Do not take these medicines before your procedure if your health care provider instructs you not to.  You may be given antibiotic medicine to help prevent infection. General instructions  You will have a heart evaluation. This may include an electrocardiogram (ECG), chest X-ray, and heart imaging (echocardiogram,  or echo) tests.  You will have blood tests.  Do not use any products that contain nicotine or tobacco, such as cigarettes and e-cigarettes. If you need help quitting, ask your health care provider.  Plan to have someone take you home from the hospital or clinic.  If you will be going home right after the procedure, plan to have someone with you for 24 hours.  Ask your health care provider how your surgical site will be marked or identified. What happens during the procedure?  To reduce your risk of infection: ? Your health care team will wash or sanitize their hands. ? Your skin will be washed with soap. ? Hair may be removed from the surgical area.  An IV tube will be inserted into one of your veins.  You will be given one or more of the following: ? A medicine to help you relax (sedative). ? A medicine  to numb the area (local  anesthetic). ? A medicine to make you fall asleep (general anesthetic).  If you are getting a transvenous pacemaker: ? An incision will be made in your upper chest. ? A pocket will be made for the pacemaker. It may be placed under the skin or between layers of muscle. ? The lead will be inserted into a blood vessel that returns to the heart. ? While X-rays are taken by an imaging machine (fluoroscopy), the lead will be advanced through the vein to the inside of your heart. ? The other end of the lead will be tunneled under the skin and attached to the pacemaker.  If you are getting an epicardial pacemaker: ? An incision will be made near your ribs or breastbone (sternum) for the lead. ? The lead will be attached to the outside of your heart. ? Another incision will be made in your chest or upper belly to create a pocket for the pacemaker. ? The free end of the lead will be tunneled under the skin and attached to the pacemaker.  The transvenous or epicardial pacemaker will be tested. Imaging studies may be done to check the lead position.  The incisions will be closed with stitches (sutures), adhesive strips, or skin glue.  Bandages (dressing) will be placed over the incisions. The procedure may vary among health care providers and hospitals. What happens after the procedure?  Your blood pressure, heart rate, breathing rate, and blood oxygen level will be monitored until the medicines you were given have worn off.  You will be given antibiotics and pain medicine.  ECG and chest x-rays will be done.  You will wear a continuous type of ECG (Holter monitor) to check your heart rhythm.  Your health care provider will program the pacemaker.  Do not drive for 24 hours if you received a sedative. This information is not intended to replace advice given to you by your health care provider. Make sure you discuss any questions you have with your health care provider. Document Released:  10/10/2002 Document Revised: 05/09/2016 Document Reviewed: 04/02/2016 Elsevier Interactive Patient Education  2018 [employer].     Pacemaker Implantation, Adult, Care After This sheet gives you information about how to care for yourself after your procedure. Your health care provider may also give you more specific instructions. If you have problems or questions, contact your health care provider. What can I expect after the procedure? After the procedure, it is common to have:  Mild pain.  Slight bruising.  Some swelling over the incision.  A slight bump over the skin where the device was placed. Sometimes, it is possible to feel the device under the skin. This is normal.  Follow these instructions at home: Medicines  Take over-the-counter and prescription medicines only as told by your health care provider.  If you were prescribed an antibiotic medicine, take it as told by your health care provider. Do not stop taking the antibiotic even if you start to feel better. Wound care  Do not remove the bandage on your chest until directed to do so by your health care provider.  After your bandage is removed, you may see pieces of tape called skin adhesive strips over the area where the cut was made (incision site). Let them fall off on their own.  Check the incision site every day to make sure it is not infected, bleeding, or starting to pull apart.  Do not use lotions or ointments near the incision  site unless directed to do so.  Keep the incision area clean and dry for 2-3 days after the procedure or as directed by your health care provider. It takes several weeks for the incision site to completely heal.  Do not take baths, swim, or use a hot tub for 7-10 days or as otherwise directed by your health care provider. Activity  Do not drive or use heavy machinery while taking prescription pain medicine.  Do not drive for 24 hours if you were given a medicine to help you relax  (sedative).  Check with your health care provider before you start to drive or play sports.  Avoid sudden jerking, pulling, or chopping movements that pull your upper arm far away from your body. Avoid these movements for at least 6 weeks or as long as told by your health care provider.  Do not lift your upper arm above your shoulders for at least 6 weeks or as long as told by your health care provider. This means no tennis, golf, or swimming.  You may go back to work when your health care provider says it is okay. Pacemaker care  You may be shown how to transfer data from your pacemaker through the phone to your health care provider.  Always let all health care providers know about your pacemaker before you have any medical procedures or tests.  Wear a medical ID bracelet or necklace stating that you have a pacemaker. Carry a pacemaker ID card with you at all times.  Your pacemaker battery will last for 5-15 years. Routine checks by your health care provider will let the health care provider know when the battery is starting to run down. The pacemaker will need to be replaced when the battery starts to run down.  Do not use amateur Chief of Staff. Other electrical devices are safe to use, including power tools, lawn mowers, and speakers. If you are unsure of whether something is safe to use, ask your health care provider.  When using your cell phone, hold it to the ear opposite the pacemaker. Do not leave your cell phone in a pocket over the pacemaker.  Avoid places or objects that have a strong electric or magnetic field, including: ? Airport Herbalist. When at the airport, let officials know that you have a pacemaker. ? Power plants. ? Large electrical generators. ? Radiofrequency transmission towers, such as cell phone and radio towers. General instructions  Weigh yourself every day. If you suddenly gain weight, fluid may be building up in your  body.  Keep all follow-up visits as told by your health care provider. This is important. Contact a health care provider if:  You gain weight suddenly.  Your legs or feet swell.  It feels like your heart is fluttering or skipping beats (heart palpitations).  You have chills or a fever.  You have more redness, swelling, or pain around your incisions.  You have more fluid or blood coming from your incisions.  Your incisions feel warm to the touch.  You have pus or a bad smell coming from your incisions. Get help right away if:  You have chest pain.  You have trouble breathing or are short of breath.  You become extremely tired.  You are light-headed or you faint. This information is not intended to replace advice given to you by your health care provider. Make sure you discuss any questions you have with your health care provider. Document Released: 05/09/2005 Document Revised:  08/01/2016 Document Reviewed: 08/01/2016 Elsevier Interactive Patient Education  2018 [place] Discharge Instructions for  Pacemaker/Defibrillator Patients  ACTIVITY No heavy lifting or vigorous activity with your left/right arm for 6 to 8 weeks.  Do not raise your left/right arm above your head for one week.  Gradually raise your affected arm as drawn below.           __  NO DRIVING for     ; you may begin driving on     .  WOUND CARE - Keep the wound area clean and dry.  Do not get this area wet for one week. No showers for one week; you may shower on     . - The tape/steri-strips on your wound will fall off; do not pull them off.  No bandage is needed on the site.  DO  NOT apply any creams, oils, or ointments to the wound area. - If you notice any drainage or discharge from the wound, any swelling or bruising at the site, or you develop a fever > 101? F after you are discharged home, call the office at once.  SPECIAL INSTRUCTIONS - You are still able to use cellular  telephones; use the ear opposite the side where you have your pacemaker/defibrillator.  Avoid carrying your cellular phone near your device. - When traveling through airports, show security personnel your identification card to avoid being screened in the metal detectors.  Ask the security personnel to use the hand wand. - Avoid arc welding equipment, MRI testing (magnetic resonance imaging), TENS units (transcutaneous nerve stimulators).  Call the office for questions about other devices. - Avoid electrical appliances that are in poor condition or are not properly grounded. - Microwave ovens are safe to be near or to operate.  ADDITIONAL INFORMATION FOR DEFIBRILLATOR PATIENTS SHOULD YOUR DEVICE GO OFF: - If your device goes off ONCE and you feel fine afterward, notify the device clinic nurses. - If your device goes off ONCE and you do not feel well afterward, call 911. - If your device goes off TWICE, call 911. - If your device goes off [place], call 911.  DO NOT DRIVE YOURSELF OR A FAMILY MEMBER WITH A DEFIBRILLATOR TO THE HOSPITAL-CALL 911.

## 2019-08-18 ENCOUNTER — Telehealth: Payer: Self-pay | Admitting: Cardiology

## 2019-08-18 NOTE — Telephone Encounter (Signed)
New message   Called patient this morning to set up f/u appt after procedure on Nov. 4th. Pt states that he would like to push out the procedure a few weeks, due to election day.

## 2019-08-22 NOTE — Telephone Encounter (Signed)
Due some business changes its going to be pretty busy at work until middle of December. Pt would like to hold off on procedure until January. Pt reports that some things have recently changed at work and they will be working hard the rest of this year.  Pt feels like waiting until after first of year will be better for him. Aware I will inform Dr. Curt Bears and call him at a later date to reschedule procedure. Patient verbalized understanding and agreeable to plan.

## 2019-08-23 DIAGNOSIS — K5900 Constipation, unspecified: Secondary | ICD-10-CM | POA: Diagnosis not present

## 2019-08-23 DIAGNOSIS — R197 Diarrhea, unspecified: Secondary | ICD-10-CM | POA: Diagnosis not present

## 2019-08-23 DIAGNOSIS — R140 Abdominal distension (gaseous): Secondary | ICD-10-CM | POA: Diagnosis not present

## 2019-09-03 ENCOUNTER — Other Ambulatory Visit (HOSPITAL_COMMUNITY): Payer: Medicare Other

## 2019-09-05 DIAGNOSIS — R197 Diarrhea, unspecified: Secondary | ICD-10-CM | POA: Diagnosis not present

## 2019-09-07 ENCOUNTER — Encounter (HOSPITAL_COMMUNITY): Payer: Self-pay

## 2019-09-07 ENCOUNTER — Ambulatory Visit (HOSPITAL_COMMUNITY): Admit: 2019-09-07 | Payer: Medicare Other | Admitting: Cardiology

## 2019-09-07 SURGERY — PACEMAKER IMPLANT

## 2019-10-10 DIAGNOSIS — R739 Hyperglycemia, unspecified: Secondary | ICD-10-CM | POA: Diagnosis not present

## 2019-10-10 DIAGNOSIS — K5900 Constipation, unspecified: Secondary | ICD-10-CM | POA: Diagnosis not present

## 2019-10-10 DIAGNOSIS — R140 Abdominal distension (gaseous): Secondary | ICD-10-CM | POA: Diagnosis not present

## 2019-10-10 DIAGNOSIS — R197 Diarrhea, unspecified: Secondary | ICD-10-CM | POA: Diagnosis not present

## 2019-10-25 ENCOUNTER — Telehealth: Payer: Self-pay | Admitting: *Deleted

## 2019-10-25 NOTE — Telephone Encounter (Signed)
Followed up with pt about PPM implant next year. Pt still very leary of the idea. Pt scheduled to further discuss with Dr. Curt Bears on 11/18/19. Patient verbalized understanding and agreeable to plan.

## 2019-11-18 ENCOUNTER — Other Ambulatory Visit: Payer: Self-pay

## 2019-11-18 ENCOUNTER — Ambulatory Visit (INDEPENDENT_AMBULATORY_CARE_PROVIDER_SITE_OTHER): Payer: Medicare Other | Admitting: Cardiology

## 2019-11-18 ENCOUNTER — Encounter: Payer: Self-pay | Admitting: Cardiology

## 2019-11-18 VITALS — BP 152/66 | HR 42 | Ht 71.0 in | Wt 240.0 lb

## 2019-11-18 DIAGNOSIS — I442 Atrioventricular block, complete: Secondary | ICD-10-CM | POA: Diagnosis not present

## 2019-11-18 NOTE — Progress Notes (Signed)
Electrophysiology Office Note   Date:  11/18/2019   ID:  Terrence Beard, Terrence Beard 22-Apr-1941, MRN [id number]  PCP:  Terrence Huddle, MD  Cardiologist:  Terrence Beard Primary Electrophysiologist:  Terrence Mellen Meredith Leeds, MD    No chief complaint on file.    History of Present Illness: Terrence Beard is a 79 y.o. male who presents today for electrophysiology evaluation.   He has a history of long standing RBBB and was found to have trifascicular block on a recent ECG.  He has periods of mobitz I block as well as first degree AV block.  He is currently asymptomatic.  He has no dizziness but does endorse shortness of breath when walking up hills.  He is able to hunt and carry his guns and ammo through the woods without issues.  He had an event monitor showing Terrence Beard with a severely prolonged PR interval.  He recently had a pulmonary embolism and is currently taking is Xarelto. Since being put on his anticoagulation, he is felt well. He is trying to get back to exercising.  He presented to [facility] with shortness of breath.  He was found to have pneumonia as well as atrial flutter.  He was initially seen in the hospital and advised to wait until his pneumonia was treated prior to ablation.  Today, denies symptoms of palpitations, chest pain,  orthopnea, PND, lower extremity edema, claudication, dizziness, presyncope, syncope, bleeding, or neurologic sequela. The patient is tolerating medications without difficulties.  He has some mild shortness of breath and fatigue.  He is able to do his daily activities despite that.  He otherwise has no major complaints.  He is very nervous about the possibility of pacemaker implant.   Past Medical History:  Diagnosis Date  . Blood clot associated with vein wall inflammation    R. calf   . CKD (chronic kidney disease) stage 3, GFR 30-59 ml/min    not treated  . DJD (degenerative joint disease)   . Dysphagia   . Erectile dysfunction   .  GERD (gastroesophageal reflux disease)   . Glucose intolerance (impaired glucose tolerance)   . History of echocardiogram    a. Echo 7/14:  EF 60-65%, mod asymmetric LVH, Mild LAE, mild to mod MR,   . History of nuclear stress test    a. ETT-Myoview 7/14:  EF 71%, normal perfusion, low risk  . Hypothyroidism   . Multiple lipomas   . Nasal polyposis   . Obesity   . RBBB   . Seasonal allergies    Past Surgical History:  Procedure Laterality Date  . COLONOSCOPY WITH PROPOFOL N/A 02/05/2016   Procedure: COLONOSCOPY WITH PROPOFOL;  Surgeon: Terrence Fair, MD;  Location: [facility];  Service: Endoscopy;  Laterality: N/A;  . LYMPH NODE BIOPSY    . TONSILLECTOMY    . TOOTH EXTRACTION  10/07/2016     Current Outpatient Medications  Medication Sig Dispense Refill  . calcium carbonate (TUMS - DOSED IN MG ELEMENTAL CALCIUM) 500 MG chewable tablet Chew 2-3 tablets by mouth at bedtime as needed for indigestion or heartburn.    . Cholecalciferol (VITAMIN D) 2000 UNITS tablet Take 1,000 Units by mouth every other day.     . levothyroxine (SYNTHROID, LEVOTHROID) 50 MCG tablet Take 50 mcg by mouth daily before breakfast.    . rivaroxaban (XARELTO) 20 MG TABS tablet Take 1 tablet (20 mg total) by mouth daily with supper. 30 tablet 0   No current facility-administered  medications for this visit.    Allergies:   Latex   Social History:  The patient  reports that he has never smoked. He has never used smokeless tobacco. He reports current alcohol use. He reports that he does not use drugs.   Family History:  The patient's family history includes Diabetes in his maternal grandmother; GER disease in his father; Kidney disease in his mother.    ROS:  Please see the history of present illness.   Otherwise, review of systems is positive for none.   All other systems are reviewed and negative.   PHYSICAL EXAM: VS:  BP (!) 152/66   Pulse (!) 42   Ht 5\' 11"  (1.803 m)   Wt 240 lb (108.9 kg)   BMI  33.47 kg/m  , BMI Body mass index is 33.47 kg/m. GEN: Well nourished, well developed, in no acute distress  HEENT: normal  Neck: no JVD, carotid bruits, or masses Cardiac: Bradycardic, regular; no murmurs, rubs, or gallops,no edema  Respiratory:  clear to auscultation bilaterally, normal work of breathing GI: soft, nontender, nondistended, + BS MS: no deformity or atrophy  Skin: warm and dry Neuro:  Strength and sensation are intact Psych: euthymic mood, full affect  EKG:  EKG is ordered today. Personal review of the ekg ordered shows sinus rhythm, first-degree AV block, PR 680 ms  Recent Labs: No results found for requested labs within last 8760 hours.    Lipid Panel  No results found for: CHOL, TRIG, HDL, CHOLHDL, VLDL, LDLCALC, LDLDIRECT   Wt Readings from Last 3 Encounters:  11/18/19 240 lb (108.9 kg)  08/02/19 238 lb (108 kg)  11/16/18 227 lb (103 kg)      Other studies Reviewed: Additional studies/ records that were reviewed today include: 48 hour monitor  Review of the above records today demonstrates:  Mean heart rate 67 bpm Maximum heart rate 109 bpm Minimum heart rate 48 bpm Ventricular beats 497 (0.32%) Supraventricular beats 542 (0.3%) Sinus rhythm with first degree AV block Intermittent Mobitz I block     TTE 11/05/17 - Left ventricle: The cavity size was normal. There was moderate   focal basal hypertrophy of the septum. Systolic function was   vigorous. The estimated ejection fraction was in the range of 65%   to 70%. Wall motion was normal; there were no regional wall   motion abnormalities. The study is not technically sufficient to   allow evaluation of LV diastolic function. - Aortic valve: Trileaflet; mildly thickened, mildly calcified   leaflets. - Mitral valve: Calcified annulus. - Right atrium: The atrium was mildly dilated. - Pericardium, extracardiac: A trivial pericardial effusion was   identified.   ASSESSMENT AND PLAN:  1.   Complete heart block: Has also had Mobitz 1 AV block and a incredibly long first-degree AV block.  He has weakness and fatigue.  We Terrence Beard thus plan for pacemaker implant.  Risks and benefits were discussed which were bleeding, tamponade, infection, pneumothorax.  He understands these risks and is agreed to the procedure.  2. Pulmonary embolism: Plan per primary physician.  3.  Typical appearing atrial flutter: Occurred with an episode of pneumonia.  No recurrence.  Currently on Xarelto.  CHA2DS2-VASc of 3.   Current medicines are reviewed at length with the patient today.   The patient does not have concerns regarding his medicines.  The following changes were made today: None  Labs/ tests ordered today include:  Orders Placed This Encounter  Procedures  .  HEART 12-Lead     Disposition:   FU with Vesper Trant 3 months  Signed, Kalika Smay Meredith Leeds, MD  11/18/2019 4:49 PM     [place] [address] [place] [id number] [phone] (office) [phone] (fax)

## 2019-11-18 NOTE — Patient Instructions (Signed)
Medication Instructions:  Your physician recommends that you continue on your current medications as directed. Please refer to the Current Medication list given to you today.  * If you need a refill on your cardiac medications before your next appointment, please call your pharmacy.   Labwork: Pre procedure labs: ___________ If you have labs (blood work) drawn today and your tests are completely normal, you will receive your results only by:  MyChart Message (if you have MyChart) OR  A paper copy in the mail If you have any lab test that is abnormal or we need to change your treatment, we will call you to review the results.  Testing/Procedures: Your physician has recommended that you have a pacemaker inserted. A pacemaker is a small device that is placed under the skin of your chest or abdomen to help control abnormal heart rhythms. This device uses electrical pulses to prompt the heart to beat at a normal rate. Pacemakers are used to treat heart rhythms that are too slow. Wire (leads) are attached to the pacemaker that goes into the chambers of you heart. This is done in the hospital and usually requires and overnight stay. Please see the instructions below  Follow-Up: Your physician recommends that you schedule a follow-up appointment in: 10-14 days, after your procedure on _________, with device clinic for a wound check.  Your physician recommends that you schedule a follow-up appointment in: 91 days, after your procedure on _________, with  Dr. Curt Bears.    Thank you for choosing [facility]!!   Trinidad Curet, RN [phone]  Any Other Special Instructions Will Be Listed Below (If Applicable).   Implantable Device Instructions  You are scheduled for: Pacemaker implant  on ____________ with Dr. Curt Bears.  1.   Pre procedure testing-             A.  LAB WORK--- On ____________ you are scheduled to have blood work at the [facility] (see address at the top of this  letter) any time after 8:00 am.  You do not need to be fasting              B. COVID TEST-- On ______________ Dennis Bast will go to [facility] hospital ([place]) for your Covid testing.   This is a drive thru test site.  There will be multiple testing areas.  Be sure to share with the first checkpoint that you are there for pre-procedure/surgery testing. This will put you into the right (yellow) lane that leads to the PAT testing team.   Stay in your car and the nurse team will come to your car to test you.  After you are tested please go home and self quarantine until the day of your procedure.    2. On the day of your procedure ___________ you will go to [facility] hospital [phone]) at ______________.  You will go to the main entrance [address] [employer]) and enter where the [employer] parking staff are.  You will check in at ADMITTING.  You may have one support person come in to the hospital with you.  They will be asked to wait in the waiting room.   3.   Do not eat or drink after midnight prior to your procedure.   4.   On the morning of your procedure do NOT take any medication.  5.  The night before your procedure and the morning of your procedure scrub your neck/chest with surgical  scrub.  An instruction letter is included with this letter. .   5.  Plan for an overnight stay.  If you use your phone frequently bring your phone charger.  When you are discharged you will need someone to drive you home.   6.  You will follow up with the [place] clinic 10-14 days after your procedure. You will follow up with Dr. Curt Bears 91 days after your procedure.  These appointments will be made for you.   * If you have ANY questions after you get home, please call the office [phone] and ask for Natthew Marlatt RN or send a MyChart message.     - Preparing For Surgery  Before surgery, you can play an important role. Because skin is not sterile, your skin needs to be as  free of germs as possible. You can reduce the number of germs on your skin by washing with CHG (chlorahexidine gluconate) Soap before surgery.  CHG is an antiseptic cleaner which kills germs and bonds with the skin to continue killing germs even after washing.   Please do not use if you have an allergy to CHG or antibacterial soaps.  If your skin becomes reddened/irritated stop using the CHG.   Do not shave (including legs and underarms) for at least 48 hours prior to first CHG shower.  It is OK to shave your face.  Please follow these instructions carefully:  1.  Shower the night before surgery and the morning of surgery with CHG.  2.  If you choose to wash your hair, wash your hair first as usual with your normal shampoo.  3.  After you shampoo, rinse your hair and body thoroughly to remove the shampoo.  4.  Use CHG as you would any other liquid soap.  You can apply CHG directly to the skin and wash gently with a clean washcloth. 5.  Apply the CHG Soap to your body ONLY FROM THE NECK DOWN.  Do not use on open wounds or open sores.  Avoid contact with your eyes, ears, mouth and genitals (private parts).  Wash genitals (private parts) with your normal soap.  6.  Wash thoroughly, paying special attention to the area where your surgery will be performed.  7.  Thoroughly rinse your body with warm water from the neck down.   8.  DO NOT shower/wash with your normal soap after using and rinsing off the CHG soap.  9.  Pat yourself dry with a clean towel.           10.  Wear clean pajamas.           11.  Place clean sheets on your bed the night of your first shower and do not sleep with pets.  Day of Surgery: Do not apply any deodorants/lotions.  Please wear clean clothes to the hospital/surgery center.     Pacemaker Implantation, Adult Pacemaker implantation is a procedure to place a pacemaker inside your chest. A pacemaker is a small computer that sends electrical signals to the heart and helps  your heart beat normally. A pacemaker also stores information about your heart rhythms. You may need pacemaker implantation if you:  Have a slow heartbeat (bradycardia).  Faint (syncope).  Have shortness of breath (dyspnea) due to heart problems. The pacemaker attaches to your heart through a wire, called a lead. Sometimes just one lead is needed. Other times, there will be two leads. There are two types of pacemakers:  Transvenous pacemaker.  This type is placed under the skin or muscle of your chest. The lead goes through a vein in the chest area to reach the inside of the heart.  Epicardial pacemaker. This type is placed under the skin or muscle of your chest or belly. The lead goes through your chest to the outside of the heart. Tell a health care provider about:  Any allergies you have.  All medicines you are taking, including vitamins, herbs, eye drops, creams, and over-the-counter medicines.  Any problems you or family members have had with anesthetic medicines.  Any blood or bone disorders you have.  Any surgeries you have had.  Any medical conditions you have.  Whether you are pregnant or may be pregnant. What are the risks? Generally, this is a safe procedure. However, problems may occur, including:  Infection.  Bleeding.  Failure of the pacemaker or the lead.  Collapse of a lung or bleeding into a lung.  Blood clot inside a blood vessel with a lead.  Damage to the heart.  Infection inside the heart (endocarditis).  Allergic reactions to medicines. What happens before the procedure? Staying hydrated Follow instructions from your health care provider about hydration, which may include:  Up to 2 hours before the procedure - you may continue to drink clear liquids, such as water, clear fruit juice, black coffee, and plain tea. Eating and drinking restrictions Follow instructions from your health care provider about eating and drinking, which may include:  8  hours before the procedure - stop eating heavy meals or foods such as meat, fried foods, or fatty foods.  6 hours before the procedure - stop eating light meals or foods, such as toast or cereal.  6 hours before the procedure - stop drinking milk or drinks that contain milk.  2 hours before the procedure - stop drinking clear liquids. Medicines  Ask your health care provider about: ? Changing or stopping your regular medicines. This is especially important if you are taking diabetes medicines or blood thinners. ? Taking medicines such as aspirin and ibuprofen. These medicines can thin your blood. Do not take these medicines before your procedure if your health care provider instructs you not to.  You may be given antibiotic medicine to help prevent infection. General instructions  You will have a heart evaluation. This may include an electrocardiogram (ECG), chest X-ray, and heart imaging (echocardiogram,  or echo) tests.  You will have blood tests.  Do not use any products that contain nicotine or tobacco, such as cigarettes and e-cigarettes. If you need help quitting, ask your health care provider.  Plan to have someone take you home from the hospital or clinic.  If you will be going home right after the procedure, plan to have someone with you for 24 hours.  Ask your health care provider how your surgical site will be marked or identified. What happens during the procedure?  To reduce your risk of infection: ? Your health care team will wash or sanitize their hands. ? Your skin will be washed with soap. ? Hair may be removed from the surgical area.  An IV tube will be inserted into one of your veins.  You will be given one or more of the following: ? A medicine to help you relax (sedative). ? A medicine to numb the area (local anesthetic). ? A medicine to make you fall asleep (general anesthetic).  If you are getting a transvenous pacemaker: ? An incision will be made in  your  upper chest. ? A pocket will be made for the pacemaker. It may be placed under the skin or between layers of muscle. ? The lead will be inserted into a blood vessel that returns to the heart. ? While X-rays are taken by an imaging machine (fluoroscopy), the lead will be advanced through the vein to the inside of your heart. ? The other end of the lead will be tunneled under the skin and attached to the pacemaker.  If you are getting an epicardial pacemaker: ? An incision will be made near your ribs or breastbone (sternum) for the lead. ? The lead will be attached to the outside of your heart. ? Another incision will be made in your chest or upper belly to create a pocket for the pacemaker. ? The free end of the lead will be tunneled under the skin and attached to the pacemaker.  The transvenous or epicardial pacemaker will be tested. Imaging studies may be done to check the lead position.  The incisions will be closed with stitches (sutures), adhesive strips, or skin glue.  Bandages (dressing) will be placed over the incisions. The procedure may vary among health care providers and hospitals. What happens after the procedure?  Your blood pressure, heart rate, breathing rate, and blood oxygen level will be monitored until the medicines you were given have worn off.  You will be given antibiotics and pain medicine.  ECG and chest x-rays will be done.  You will wear a continuous type of ECG (Holter monitor) to check your heart rhythm.  Your health care provider will program the pacemaker.  Do not drive for 24 hours if you received a sedative. This information is not intended to replace advice given to you by your health care provider. Make sure you discuss any questions you have with your health care provider. Document Revised: 07/09/2018 Document Reviewed: 04/02/2016 Elsevier Patient Education  [place].

## 2019-12-22 ENCOUNTER — Telehealth: Payer: Self-pay | Admitting: *Deleted

## 2019-12-22 NOTE — Telephone Encounter (Signed)
Left message to call back next week to arrange PPM implant date

## 2020-01-13 NOTE — Telephone Encounter (Signed)
Lmtcb.

## 2020-02-03 ENCOUNTER — Encounter: Payer: Self-pay | Admitting: *Deleted

## 2020-02-03 ENCOUNTER — Telehealth: Payer: Self-pay | Admitting: *Deleted

## 2020-02-03 NOTE — Telephone Encounter (Signed)
After many unsuccessful attempts to reach pt/return phone call to schedule PPM implant,  Sending certified letter asking pt to call office to schedule OV w/ Dr. Curt Bears to further discuss/evalutate heart block

## 2020-03-23 NOTE — Telephone Encounter (Signed)
Certified letter returned "unclaimed". Will have copy of returned mail scanned into pt chart.

## 2020-06-02 ENCOUNTER — Ambulatory Visit: Payer: Medicare Other | Attending: Internal Medicine

## 2020-06-02 DIAGNOSIS — Z23 Encounter for immunization: Secondary | ICD-10-CM

## 2020-07-13 DIAGNOSIS — N183 Chronic kidney disease, stage 3 unspecified: Secondary | ICD-10-CM | POA: Diagnosis not present

## 2020-07-13 DIAGNOSIS — Z0000 Encounter for general adult medical examination without abnormal findings: Secondary | ICD-10-CM | POA: Diagnosis not present

## 2020-07-13 DIAGNOSIS — D6869 Other thrombophilia: Secondary | ICD-10-CM | POA: Diagnosis not present

## 2020-07-13 DIAGNOSIS — E669 Obesity, unspecified: Secondary | ICD-10-CM | POA: Diagnosis not present

## 2020-07-13 DIAGNOSIS — E559 Vitamin D deficiency, unspecified: Secondary | ICD-10-CM | POA: Diagnosis not present

## 2020-07-13 DIAGNOSIS — I4891 Unspecified atrial fibrillation: Secondary | ICD-10-CM | POA: Diagnosis not present

## 2020-07-13 DIAGNOSIS — D6851 Activated protein C resistance: Secondary | ICD-10-CM | POA: Diagnosis not present

## 2020-07-13 DIAGNOSIS — E039 Hypothyroidism, unspecified: Secondary | ICD-10-CM | POA: Diagnosis not present

## 2020-07-13 DIAGNOSIS — I2699 Other pulmonary embolism without acute cor pulmonale: Secondary | ICD-10-CM | POA: Diagnosis not present

## 2020-07-13 DIAGNOSIS — R140 Abdominal distension (gaseous): Secondary | ICD-10-CM | POA: Diagnosis not present

## 2020-07-13 DIAGNOSIS — R739 Hyperglycemia, unspecified: Secondary | ICD-10-CM | POA: Diagnosis not present

## 2020-07-13 DIAGNOSIS — K5900 Constipation, unspecified: Secondary | ICD-10-CM | POA: Diagnosis not present

## 2020-07-13 DIAGNOSIS — K222 Esophageal obstruction: Secondary | ICD-10-CM | POA: Diagnosis not present

## 2020-07-13 DIAGNOSIS — Z1389 Encounter for screening for other disorder: Secondary | ICD-10-CM | POA: Diagnosis not present

## 2020-07-17 DIAGNOSIS — R739 Hyperglycemia, unspecified: Secondary | ICD-10-CM | POA: Diagnosis not present

## 2020-07-17 DIAGNOSIS — Z79899 Other long term (current) drug therapy: Secondary | ICD-10-CM | POA: Diagnosis not present

## 2020-07-17 DIAGNOSIS — E039 Hypothyroidism, unspecified: Secondary | ICD-10-CM | POA: Diagnosis not present

## 2020-07-17 DIAGNOSIS — E559 Vitamin D deficiency, unspecified: Secondary | ICD-10-CM | POA: Diagnosis not present

## 2020-07-17 DIAGNOSIS — R7309 Other abnormal glucose: Secondary | ICD-10-CM | POA: Diagnosis not present

## 2020-11-05 DIAGNOSIS — C44319 Basal cell carcinoma of skin of other parts of face: Secondary | ICD-10-CM | POA: Diagnosis not present

## 2020-11-05 DIAGNOSIS — Z85828 Personal history of other malignant neoplasm of skin: Secondary | ICD-10-CM | POA: Diagnosis not present

## 2020-11-05 DIAGNOSIS — D485 Neoplasm of uncertain behavior of skin: Secondary | ICD-10-CM | POA: Diagnosis not present

## 2020-12-11 DIAGNOSIS — Z85828 Personal history of other malignant neoplasm of skin: Secondary | ICD-10-CM | POA: Diagnosis not present

## 2020-12-11 DIAGNOSIS — C44212 Basal cell carcinoma of skin of right ear and external auricular canal: Secondary | ICD-10-CM | POA: Diagnosis not present

## 2021-02-11 ENCOUNTER — Telehealth: Payer: Self-pay | Admitting: Cardiology

## 2021-02-11 NOTE — Telephone Encounter (Signed)
Patient called to schedule pacemaker implant surgery. Please call back

## 2021-02-13 NOTE — Telephone Encounter (Signed)
Pt scheduled for 5/3 to for further evaluation/discussion in PPM is still recommended/needed. Pt appreciates my return call and agreeable to plan.

## 2021-03-05 ENCOUNTER — Ambulatory Visit (INDEPENDENT_AMBULATORY_CARE_PROVIDER_SITE_OTHER): Payer: Medicare Other | Admitting: Cardiology

## 2021-03-05 ENCOUNTER — Encounter: Payer: Self-pay | Admitting: Cardiology

## 2021-03-05 ENCOUNTER — Other Ambulatory Visit: Payer: Self-pay

## 2021-03-05 VITALS — BP 160/70 | HR 42 | Ht 71.0 in | Wt 244.0 lb

## 2021-03-05 DIAGNOSIS — I441 Atrioventricular block, second degree: Secondary | ICD-10-CM

## 2021-03-05 NOTE — Progress Notes (Signed)
Electrophysiology Office Note   Date:  03/06/2021   ID:  Terrence Beard, Terrence Beard [birth year], MRN [id number]  PCP:  Josetta Huddle, MD  Cardiologist:  Richardson Dopp Primary Electrophysiologist:  Sindia Kowalczyk Meredith Leeds, MD    No chief complaint on file.    History of Present Illness: Terrence Beard is a 80 y.o. male who presents today for electrophysiology evaluation.     He has a history of right bundle branch block.  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  He has periods of Mobitz 1 AV block as well as complete heart block.  He presented to [facility] 11/03/2018 with shortness of breath.  He was found to have pneumonia as well as atrial flutter.  He also has a history of pulmonary embolism, and is on Xarelto.  Today, denies symptoms of palpitations, chest pain, shortness of breath, orthopnea, PND, lower extremity edema, claudication, dizziness, presyncope, syncope, bleeding, or neurologic sequela. The patient is tolerating medications without difficulties.  Main complaint today is fatigue.  He has been more fatigued than usual over the last few months.  He has also had a few near syncopal episodes.  At this point, he is ready for pacemaker implant.   Past Medical History:  Diagnosis Date  . Blood clot associated with vein wall inflammation    R. calf   . CKD (chronic kidney disease) stage 3, GFR 30-59 ml/min (HCC)    not treated  . DJD (degenerative joint disease)   . Dysphagia   . Erectile dysfunction   . GERD (gastroesophageal reflux disease)   . Glucose intolerance (impaired glucose tolerance)   . History of echocardiogram    a. Echo 7/14:  EF 60-65%, mod asymmetric LVH, Mild LAE, mild to mod MR,   . History of nuclear stress test    a. ETT-Myoview 7/14:  EF 71%, normal perfusion, low risk  . Hypothyroidism   . Multiple lipomas   . Nasal polyposis   . Obesity   . RBBB   . Seasonal allergies    Past Surgical History:  Procedure Laterality Date   . COLONOSCOPY WITH PROPOFOL N/A 02/05/2016   Procedure: COLONOSCOPY WITH PROPOFOL;  Surgeon: Garlan Fair, MD;  Location: [facility];  Service: Endoscopy;  Laterality: N/A;  . LYMPH NODE BIOPSY    . TONSILLECTOMY    . TOOTH EXTRACTION  10/07/2016     Current Outpatient Medications  Medication Sig Dispense Refill  . calcium carbonate (TUMS - DOSED IN MG ELEMENTAL CALCIUM) 500 MG chewable tablet Chew 2-3 tablets by mouth at bedtime as needed for indigestion or heartburn.    . Cholecalciferol (VITAMIN D) 2000 UNITS tablet Take 1,000 Units by mouth every other day.     . levothyroxine (SYNTHROID, LEVOTHROID) 50 MCG tablet Take 50 mcg by mouth daily before breakfast.    . rivaroxaban (XARELTO) 20 MG TABS tablet Take 1 tablet (20 mg total) by mouth daily with supper. 30 tablet 0   No current facility-administered medications for this visit.    Allergies:   Latex   Social History:  The patient  reports that he has never smoked. He has never used smokeless tobacco. He reports current alcohol use. He reports that he does not use drugs.   Family History:  The patient's family history includes Diabetes in his maternal grandmother; GER disease in his father; Kidney disease in his mother.   ROS:  Please see the history of present illness.   Otherwise, review  of systems is positive for none.   All other systems are reviewed and negative.   PHYSICAL EXAM: VS:  BP (!) 160/70   Pulse (!) 42   Ht 5\' 11"  (1.803 m)   Wt 244 lb (110.7 kg)   SpO2 98%   BMI 34.03 kg/m  , BMI Body mass index is 34.03 kg/m. GEN: Well nourished, well developed, in no acute distress  HEENT: normal  Neck: no JVD, carotid bruits, or masses Cardiac: Bradycardic, regular; no murmurs, rubs, or gallops,no edema  Respiratory:  clear to auscultation bilaterally, normal work of breathing GI: soft, nontender, nondistended, + BS MS: no deformity or atrophy  Skin: warm and dry Neuro:  Strength and sensation are  intact Psych: euthymic mood, full affect  EKG:  EKG is ordered today. Personal review of the ekg ordered shows sinus rhythm, 2 1 AV block, IVCD  Recent Labs: No results found for requested labs within last 8760 hours.    Lipid Panel  No results found for: CHOL, TRIG, HDL, CHOLHDL, VLDL, LDLCALC, LDLDIRECT   Wt Readings from Last 3 Encounters:  03/05/21 244 lb (110.7 kg)  11/18/19 240 lb (108.9 kg)  08/02/19 238 lb (108 kg)      Other studies Reviewed: Additional studies/ records that were reviewed today include: 48 hour monitor  Review of the above records today demonstrates:  Mean heart rate 67 bpm Maximum heart rate 109 bpm Minimum heart rate 48 bpm Ventricular beats 497 (0.32%) Supraventricular beats 542 (0.3%) Sinus rhythm with first degree AV block Intermittent Mobitz I block     TTE 11/05/17 - Left ventricle: The cavity size was normal. There was moderate   focal basal hypertrophy of the septum. Systolic function was   vigorous. The estimated ejection fraction was in the range of 65%   to 70%. Wall motion was normal; there were no regional wall   motion abnormalities. The study is not technically sufficient to   allow evaluation of LV diastolic function. - Aortic valve: Trileaflet; mildly thickened, mildly calcified   leaflets. - Mitral valve: Calcified annulus. - Right atrium: The atrium was mildly dilated. - Pericardium, extracardiac: A trivial pericardial effusion was   identified.   ASSESSMENT AND PLAN:  1.  Complete heart block: Also has had Mobitz 1 AV block and a incredibly long first-degree AV block.  He has been resistant to pacemaker implant in the past, though he is interested now.  Risk and benefits were discussed include bleeding, tamponade, infection, pneumothorax, lead dislodgment, among others.  He understands these risks and has agreed to the procedure.    2.  Pulmonary embolism: Plan per primary physician  3.  Typical atrial flutter:  Appeared after an episode of pneumonia.  No recurrences.  Currently on Xarelto.  CHA2DS2-VASc of 3.  No further episodes.   Current medicines are reviewed at length with the patient today.   The patient does not have concerns regarding his medicines.  The following changes were made today: None  Labs/ tests ordered today include:  Orders Placed This Encounter  Procedures  . EKG 12-Lead     Disposition:   FU with Yu Cragun 3 months  Signed, Lavaughn Haberle Meredith Leeds, MD  03/06/2021 7:52 AM     [facility] [address] [place] [address] [phone] (office) [phone] (fax)

## 2021-03-05 NOTE — Patient Instructions (Addendum)
Medication Instructions:  Your physician recommends that you continue on your current medications as directed. Please refer to the Current Medication list given to you today.  *If you need a refill on your cardiac medications before your next appointment, please call your pharmacy*   Lab Work: None ordered If you have labs (blood work) drawn today and your tests are completely normal, you will receive your results only by: Marland Kitchen MyChart Message (if you have MyChart) OR . A paper copy in the mail If you have any lab test that is abnormal or we need to change your treatment, we will call you to review the results.   Testing/Procedures: Your physician has recommended that you have a pacemaker inserted. A pacemaker is a small device that is placed under the skin of your chest or abdomen to help control abnormal heart rhythms. This device uses electrical pulses to prompt the heart to beat at a normal rate. Pacemakers are used to treat heart rhythms that are too slow. Wire (leads) are attached to the pacemaker that goes into the chambers of you heart. This is done in the hospital and usually requires and overnight stay. Please see the instruction sheet below located under "other instructions".     Follow-Up: At [facility], you and your health needs are our priority.  As part of our continuing mission to provide you with exceptional heart care, we have created designated Provider Care Teams.  These Care Teams include your primary Cardiologist (physician) and Advanced Practice Providers (APPs -  Physician Assistants and Nurse Practitioners) who all work together to provide you with the care you need, when you need it.  Your next appointment:   2 week(s) after your pacemaker implant  The format for your next appointment:   In Person  Provider:   device clinic for a wound check    Thank you for choosing [facility]!!   Trinidad Curet, RN [phone]   Other Instructions    Implantable Device Instructions  You are scheduled for: Pacemaker implant on 04/17/2021 with Dr. Curt Bears.  1.   Pre procedure testing-             A.  LAB WORK--- On 04/16/2021 (prior to your Covid screening)  for your pre procedure blood work.  You do NOT need to be fasting. Stop by the [employer].              B. COVID TEST-- On 04/16/2021 @ 1:00 pm (after blood work)  - This is a Financial risk analyst at [address] [place],  [address].  Someone will direct you to the appropriate testing line. Stay in your car and someone will be with you shortly.   After you are tested please go home and self quarantine until the day of your procedure.    2. On the day of your procedure 04/17/2021 you will go to [facility] [phone]) at 9:30 am.  Dennis Bast will go to the main entrance [address] [employer]) and enter where the [employer] are.  You will check in at ADMITTING.  You may have one support person come in to the hospital with you.  They will be asked to wait in the waiting room.   3.   Do not eat or drink after midnight prior to your procedure.   4.   On the morning of your procedure do NOT take any medication.  5.  The night before your procedure  and the morning of your procedure scrub your neck/chest with surgical scrub.  See instruction letter.   5.  Plan for an overnight stay, but you may be discharged home after your procedure. If you use your phone frequently bring your phone charger, in case you have to stay.  If you are discharged after your procedure you will need someone to drive you home and be with your for 24 hours after your procedure.   6.  You will follow up with the [place] clinic 10-14 days after your procedure. You will follow up with Dr. Curt Bears 91 days after your procedure.  These appointments will be made for you.   * If you have ANY questions after you get home, please call the office [phone] and ask for Misbah Hornaday RN or send a MyChart  message.   [place] - Preparing For Surgery  Before surgery, you can play an important role. Because skin is not sterile, your skin needs to be as free of germs as possible. You can reduce the number of germs on your skin by washing with CHG (chlorahexidine gluconate) Soap before surgery.  CHG is an antiseptic cleaner which kills germs and bonds with the skin to continue killing germs even after washing.   Please do not use if you have an allergy to CHG or antibacterial soaps.  If your skin becomes reddened/irritated stop using the CHG.   Do not shave (including legs and underarms) for at least 48 hours prior to first CHG shower.  It is OK to shave your face.  Please follow these instructions carefully:  1.  Shower the night before surgery and the morning of surgery with CHG.  2.  If you choose to wash your hair, wash your hair first as usual with your normal shampoo.  3.  After you shampoo, rinse your hair and body thoroughly to remove the shampoo.  4.  Use CHG as you would any other liquid soap.  You can apply CHG directly to the skin and wash gently with a clean washcloth. 5.  Apply the CHG Soap to your body ONLY FROM THE NECK DOWN.  Do not use on open wounds or open sores.  Avoid contact with your eyes, ears, mouth and genitals (private parts).  Wash genitals (private parts) with your normal soap.  6.  Wash thoroughly, paying special attention to the area where your surgery will be performed.  7.  Thoroughly rinse your body with warm water from the neck down.   8.  DO NOT shower/wash with your normal soap after using and rinsing off the CHG soap.  9.  Pat yourself dry with a clean towel.           10.  Wear clean pajamas.           11.  Place clean sheets on your bed the night of your first shower and do not sleep with pets.  Day of Surgery: Do not apply any deodorants/lotions.  Please wear clean clothes to the hospital/surgery center.    Pacemaker Implantation, Adult Pacemaker  implantation is a procedure to place a pacemaker inside the chest. A pacemaker is a small computer that sends electrical signals to the heart and helps the heart beat normally. A pacemaker also stores information about heart rhythms. You may need pacemaker implantation if you have:  A slow heartbeat (bradycardia).  Loss of consciousness that happens repeatedly (syncope) or repeated episodes of dizziness or light-headedness because of an  irregular heart rate.  Shortness of breath (dyspnea) due to heart problems. The pacemaker usually attaches to your heart through a wire called a lead. One or two leads may be needed. There are different types of pacemakers:  Transvenous pacemaker. This type is placed under the skin or muscle of your upper chest area. The lead goes through a vein in the chest area to reach the inside of the heart.  Epicardial pacemaker. This type is placed under the skin or muscle of your chest or abdomen. The lead goes through your chest to the outside of the heart. Tell a health care provider about:  Any allergies you have.  All medicines you are taking, including vitamins, herbs, eye drops, creams, and over-the-counter medicines.  Any problems you or family members have had with anesthetic medicines.  Any blood or bone disorders you have.  Any surgeries you have had.  Any medical conditions you have.  Whether you are pregnant or may be pregnant. What are the risks? Generally, this is a safe procedure. However, problems may occur, including:  Infection.  Bleeding.  Failure of the pacemaker or the lead.  Collapse of a lung or bleeding into a lung.  Blood clot inside a blood vessel with a lead.  Damage to the heart.  Infection inside the heart (endocarditis).  Allergic reactions to medicines. What happens before the procedure? Staying hydrated Follow instructions from your health care provider about hydration, which may include:  Up to 2 hours before  the procedure - you may continue to drink clear liquids, such as water, clear fruit juice, black coffee, and plain tea.   Eating and drinking restrictions Follow instructions from your health care provider about eating and drinking, which may include:  8 hours before the procedure - stop eating heavy meals or foods, such as meat, fried foods, or fatty foods.  6 hours before the procedure - stop eating light meals or foods, such as toast or cereal.  6 hours before the procedure - stop drinking milk or drinks that contain milk.  2 hours before the procedure - stop drinking clear liquids. Medicines Ask your health care provider about:  Changing or stopping your regular medicines. This is especially important if you are taking diabetes medicines or blood thinners.  Taking medicines such as aspirin and ibuprofen. These medicines can thin your blood. Do not take these medicines unless your health care provider tells you to take them.  Taking over-the-counter medicines, vitamins, herbs, and supplements. Tests You may have:  A heart evaluation. This may include: ? An electrocardiogram (ECG). This involves placing patches on your skin to check your heart rhythm. ? A chest X-ray. ? An echocardiogram. This is a test that uses sound waves (ultrasound) to produce an image of the heart. ? A cardiac rhythm monitor. This is used to record your heart rhythm and any events for a longer period of time.  Blood tests.  Genetic testing. General instructions  Do not use any products that contain nicotine or tobacco for at least 4 weeks before the procedure. These products include cigarettes, e-cigarettes, and chewing tobacco. If you need help quitting, ask your health care provider.  Ask your health care provider: ? How your surgery site will be marked. ? What steps will be taken to help prevent infection. These steps may include:  Removing hair at the surgery site.  Washing skin with a  germ-killing soap.  Receiving antibiotic medicine.  Plan to have someone take you home from  the hospital or clinic.  If you will be going home right after the procedure, plan to have someone with you for 24 hours. What happens during the procedure?  An IV will be inserted into one of your veins.  You will be given one or more of the following: ? A medicine to help you relax (sedative). ? A medicine to numb the area (local anesthetic). ? A medicine to make you fall asleep (general anesthetic).  The next steps vary depending on the type of pacemaker you will be getting. ? If you are getting a transvenous pacemaker:  An incision will be made in your upper chest.  A pocket will be made for the pacemaker. It may be placed under the skin or between layers of muscle.  The lead will be inserted into a blood vessel that goes to the heart.  While X-rays are taken by an imaging machine (fluoroscopy), the lead will be advanced through the vein to the inside of your heart.  The other end of the lead will be tunneled under the skin and attached to the pacemaker. ? If you are getting an epicardial pacemaker:  An incision will be made near your ribs or breastbone (sternum) for the lead.  The lead will be attached to the outside of your heart.  Another incision will be made in your chest or upper abdomen to create a pocket for the pacemaker.  The free end of the lead will be tunneled under the skin and attached to the pacemaker.  The transvenous or epicardial pacemaker will be tested. Imaging studies may be done to check the lead position.  The incisions will be closed with stitches (sutures), adhesive strips, or skin glue.  Bandages (dressings) will be placed over the incisions. The procedure may vary among health care providers and hospitals. What happens after the procedure?  Your blood pressure, heart rate, breathing rate, and blood oxygen level will be monitored until you leave the  hospital or clinic.  You may be given antibiotics.  You will be given pain medicine.  An ECG and chest X-rays will be done.  You may need to wear a continuous type of ECG (Holter monitor) to check your heart rhythm.  Your health care provider will program the pacemaker.  If you were given a sedative during the procedure, it can affect you for several hours. Do not drive or operate machinery until your health care provider says that it is safe.  You will be given a pacemaker identification card. This card lists the implant date, device model, and manufacturer of your pacemaker. Summary  A pacemaker is a small computer that sends electrical signals to the heart and helps the heart beat normally.  There are different types of pacemakers. A pacemaker may be placed under the skin or muscle of your chest or abdomen.  Follow instructions from your health care provider about eating and drinking and about taking medicines before the procedure. This information is not intended to replace advice given to you by your health care provider. Make sure you discuss any questions you have with your health care provider. Document Revised: 09/21/2019 Document Reviewed: 09/21/2019 Elsevier Patient Education  2021 [place] Discharge Instructions for  Pacemaker/Defibrillator Patients  Activity No heavy lifting or vigorous activity with your left/right arm for 6 to 8 weeks.  Do not raise your left/right arm above your head for one week.  Gradually raise your affected arm as drawn  below.           __  NO DRIVING for     ; you may begin driving on     .  WOUND CARE - Keep the wound area clean and dry.  Do not get this area wet for one week. No showers for one week; you may shower on     . - The tape/steri-strips on your wound will fall off; do not pull them off.  No bandage is needed on the site.  DO  NOT apply any creams, oils, or ointments to the wound area. - If you  notice any drainage or discharge from the wound, any swelling or bruising at the site, or you develop a fever > 101? F after you are discharged home, call the office at once.  Special Instructions - You are still able to use cellular telephones; use the ear opposite the side where you have your pacemaker/defibrillator.  Avoid carrying your cellular phone near your device. - When traveling through airports, show security personnel your identification card to avoid being screened in the metal detectors.  Ask the security personnel to use the hand wand. - Avoid arc welding equipment, MRI testing (magnetic resonance imaging), TENS units (transcutaneous nerve stimulators).  Call the office for questions about other devices. - Avoid electrical appliances that are in poor condition or are not properly grounded. - Microwave ovens are safe to be near or to operate.  Additional information for defibrillator patients should your device go off: - If your device goes off ONCE and you feel fine afterward, notify the device clinic nurses. - If your device goes off ONCE and you do not feel well afterward, call 911. - If your device goes off TWICE, call 911. - If your device goes off THREE times in one day, call 911.  DO NOT DRIVE YOURSELF OR A FAMILY MEMBER WITH A DEFIBRILLATOR TO THE HOSPITAL--CALL 911.

## 2021-04-10 ENCOUNTER — Telehealth: Payer: Self-pay | Admitting: *Deleted

## 2021-04-10 NOTE — Telephone Encounter (Signed)
Pt returned my call from 6/6 Overviewed procedure instructions again. Pt aware Covid testing no longer required and 6/14 appt will be cancelled. He confirms he has surgical scrub. Aware to arrive at 12:30 day of procedure (and not 9:30 as originally instructed). Patient verbalized understanding and agreeable to plan.

## 2021-04-16 ENCOUNTER — Other Ambulatory Visit: Payer: Self-pay

## 2021-04-16 ENCOUNTER — Other Ambulatory Visit: Payer: Medicare Other | Admitting: *Deleted

## 2021-04-16 ENCOUNTER — Other Ambulatory Visit (HOSPITAL_COMMUNITY): Payer: Medicare Other

## 2021-04-16 ENCOUNTER — Other Ambulatory Visit: Payer: Self-pay | Admitting: *Deleted

## 2021-04-16 DIAGNOSIS — I442 Atrioventricular block, complete: Secondary | ICD-10-CM

## 2021-04-16 DIAGNOSIS — Z01812 Encounter for preprocedural laboratory examination: Secondary | ICD-10-CM

## 2021-04-16 NOTE — Pre-Procedure Instructions (Signed)
Attempted to call patient regarding procedure instructions for tomorrow.  Left voice on the following items: Arrival time 1230 Nothing to eat or drink after midnight No meds AM of procedure Responsible person to drive you home and stay with you for 24 hrs Wash with special soap night before and morning of procedure If on anti-coagulant drug instructions Hold Xarelto tonight

## 2021-04-17 ENCOUNTER — Ambulatory Visit (HOSPITAL_COMMUNITY)
Admission: RE | Admit: 2021-04-17 | Discharge: 2021-04-17 | Disposition: A | Discharge status: Home / Self Care | Payer: Medicare Other | Attending: Cardiology | Admitting: Cardiology

## 2021-04-17 ENCOUNTER — Ambulatory Visit (HOSPITAL_COMMUNITY)
Admission: RE | Disposition: A | Discharge status: Home / Self Care | Payer: Self-pay | Source: Home / Self Care | Attending: Cardiology

## 2021-04-17 ENCOUNTER — Other Ambulatory Visit: Payer: Self-pay

## 2021-04-17 ENCOUNTER — Ambulatory Visit (HOSPITAL_COMMUNITY): Payer: Medicare Other

## 2021-04-17 DIAGNOSIS — Z86711 Personal history of pulmonary embolism: Secondary | ICD-10-CM | POA: Insufficient documentation

## 2021-04-17 DIAGNOSIS — Z841 Family history of disorders of kidney and ureter: Secondary | ICD-10-CM | POA: Diagnosis not present

## 2021-04-17 DIAGNOSIS — E039 Hypothyroidism, unspecified: Secondary | ICD-10-CM | POA: Diagnosis not present

## 2021-04-17 DIAGNOSIS — E669 Obesity, unspecified: Secondary | ICD-10-CM | POA: Diagnosis not present

## 2021-04-17 DIAGNOSIS — Z6831 Body mass index (BMI) 31.0-31.9, adult: Secondary | ICD-10-CM | POA: Insufficient documentation

## 2021-04-17 DIAGNOSIS — N183 Chronic kidney disease, stage 3 unspecified: Secondary | ICD-10-CM | POA: Insufficient documentation

## 2021-04-17 DIAGNOSIS — I442 Atrioventricular block, complete: Secondary | ICD-10-CM | POA: Insufficient documentation

## 2021-04-17 DIAGNOSIS — R21 Rash and other nonspecific skin eruption: Secondary | ICD-10-CM | POA: Diagnosis not present

## 2021-04-17 DIAGNOSIS — Z95818 Presence of other cardiac implants and grafts: Secondary | ICD-10-CM

## 2021-04-17 DIAGNOSIS — Z950 Presence of cardiac pacemaker: Secondary | ICD-10-CM | POA: Diagnosis not present

## 2021-04-17 DIAGNOSIS — Z91040 Latex allergy status: Secondary | ICD-10-CM | POA: Insufficient documentation

## 2021-04-17 DIAGNOSIS — I517 Cardiomegaly: Secondary | ICD-10-CM | POA: Diagnosis not present

## 2021-04-17 DIAGNOSIS — J9811 Atelectasis: Secondary | ICD-10-CM | POA: Diagnosis not present

## 2021-04-17 HISTORY — PX: PACEMAKER IMPLANT: EP1218

## 2021-04-17 LAB — BASIC METABOLIC PANEL
BUN/Creatinine Ratio: 13 (ref 10–24)
BUN: 20 mg/dL (ref 8–27)
CO2: 21 mmol/L (ref 20–29)
Calcium: 10.4 mg/dL — ABNORMAL HIGH (ref 8.6–10.2)
Chloride: 103 mmol/L (ref 96–106)
Creatinine, Ser: 1.49 mg/dL — ABNORMAL HIGH (ref 0.76–1.27)
Glucose: 86 mg/dL (ref 65–99)
Potassium: 4.6 mmol/L (ref 3.5–5.2)
Sodium: 138 mmol/L (ref 134–144)
eGFR: 47 mL/min/{1.73_m2} — ABNORMAL LOW (ref >59)

## 2021-04-17 LAB — CBC
Hematocrit: 45.6 % (ref 37.5–51.0)
Hemoglobin: 14.9 g/dL (ref 13.0–17.7)
MCH: 29.9 pg (ref 26.6–33.0)
MCHC: 32.7 g/dL (ref 31.5–35.7)
MCV: 91 fL (ref 79–97)
Platelets: 215 10*3/uL (ref 150–450)
RBC: 4.99 x10E6/uL (ref 4.14–5.80)
RDW: 13.2 % (ref 11.6–15.4)
WBC: 8.7 10*3/uL (ref 3.4–10.8)

## 2021-04-17 SURGERY — PACEMAKER IMPLANT

## 2021-04-17 MED ORDER — CEFAZOLIN SODIUM-DEXTROSE 2-4 GM/100ML-% IV SOLN
2.0000 g | INTRAVENOUS | Status: AC
  Administered 2021-04-17: 2 g via INTRAVENOUS

## 2021-04-17 MED ORDER — HEPARIN (PORCINE) IN NACL 1000-0.9 UT/500ML-% IV SOLN
INTRAVENOUS | Status: DC | PRN
  Administered 2021-04-17: 500 mL

## 2021-04-17 MED ORDER — CEFAZOLIN SODIUM-DEXTROSE 1-4 GM/50ML-% IV SOLN
INTRAVENOUS | Status: AC
  Administered 2021-04-17: 1 g via INTRAVENOUS
  Filled 2021-04-17: qty 50

## 2021-04-17 MED ORDER — SODIUM CHLORIDE 0.9 % IV SOLN
80.0000 mg | INTRAVENOUS | Status: AC
  Administered 2021-04-17: 80 mg

## 2021-04-17 MED ORDER — LIDOCAINE HCL (PF) 1 % IJ SOLN
INTRAMUSCULAR | Status: AC
  Filled 2021-04-17: qty 30

## 2021-04-17 MED ORDER — CEFAZOLIN SODIUM-DEXTROSE 1-4 GM/50ML-% IV SOLN
1.0000 g | Freq: Four times a day (QID) | INTRAVENOUS | Status: DC
Start: 2021-04-17 — End: 2021-04-18

## 2021-04-17 MED ORDER — FENTANYL CITRATE (PF) 100 MCG/2ML IJ SOLN
INTRAMUSCULAR | Status: AC
  Filled 2021-04-17: qty 2

## 2021-04-17 MED ORDER — LIDOCAINE HCL (PF) 1 % IJ SOLN
INTRAMUSCULAR | Status: DC | PRN
  Administered 2021-04-17: 60 mL

## 2021-04-17 MED ORDER — ACETAMINOPHEN 325 MG PO TABS: 325.0000 mg | ORAL_TABLET | ORAL | Status: DC | PRN

## 2021-04-17 MED ORDER — CEFAZOLIN SODIUM-DEXTROSE 2-4 GM/100ML-% IV SOLN
INTRAVENOUS | Status: AC
  Filled 2021-04-17: qty 100

## 2021-04-17 MED ORDER — MIDAZOLAM HCL 5 MG/5ML IJ SOLN
INTRAMUSCULAR | Status: AC
  Filled 2021-04-17: qty 5

## 2021-04-17 MED ORDER — SODIUM CHLORIDE 0.9 % IV SOLN
INTRAVENOUS | Status: AC
  Filled 2021-04-17: qty 2

## 2021-04-17 MED ORDER — SODIUM CHLORIDE 0.9 % IV SOLN: INTRAVENOUS | Status: DC

## 2021-04-17 MED ORDER — LIDOCAINE HCL (PF) 1 % IJ SOLN
INTRAMUSCULAR | Status: AC
  Filled 2021-04-17: qty 60

## 2021-04-17 MED ORDER — HEPARIN (PORCINE) IN NACL 1000-0.9 UT/500ML-% IV SOLN
INTRAVENOUS | Status: AC
  Filled 2021-04-17: qty 500

## 2021-04-17 MED ORDER — ONDANSETRON HCL 4 MG/2ML IJ SOLN: 4.0000 mg | Freq: Four times a day (QID) | INTRAMUSCULAR | Status: DC | PRN

## 2021-04-17 SURGICAL SUPPLY — 9 items
CABLE SURGICAL S-101-97-12 (CABLE) ×3 IMPLANT
LEAD TENDRIL MRI 52CM LPA1200M (Lead) ×3 IMPLANT
LEAD TENDRIL MRI 58CM LPA1200M (Lead) ×3 IMPLANT
MAT PREVALON FULL STRYKER (MISCELLANEOUS) ×3 IMPLANT
PACEMAKER ASSURITY DR-RF (Pacemaker) ×3 IMPLANT
PAD PRO RADIOLUCENT 2001M-C (PAD) ×3 IMPLANT
SHEATH 8FR PRELUDE SNAP 13 (SHEATH) ×6 IMPLANT
SHEATH PROBE COVER 6X72 (BAG) ×3 IMPLANT
TRAY PACEMAKER INSERTION (PACKS) ×3 IMPLANT

## 2021-04-17 NOTE — Discharge Instructions (Addendum)
    Supplemental Discharge Instructions for  Pacemaker/Defibrillator Patients  Tomorrow, 04/18/21, send in a device transmission  Activity No heavy lifting or vigorous activity with your left/right arm for 6 to 8 weeks.  Do not raise your left/right arm above your head for one week.  Gradually raise your affected arm as drawn below.                 04/25/21                 04/26/21                   04/27/21                 04/28/21 __  NO DRIVING until cleared to at your wound check visit.  WOUND CARE Keep the wound area clean and dry.  Do not get this area wet , no showers until cleared to at your wound check visit. Tomorrow, 04/18/21, remove the arm sling Tomorrow, 04/18/21 remove the outer plastic bandage.  Underneath the plastic bandage there are steri strips (paper tapes), DO NOT remove these. The tape/steri-strips on your wound will fall off; do not pull them off.  No bandage is needed on the site.  DO  NOT apply any creams, oils, or ointments to the wound area. If you notice any drainage or discharge from the wound, any swelling or bruising at the site, or you develop a fever > 101? F after you are discharged home, call the office at once.  Special Instructions You are still able to use cellular telephones; use the ear opposite the side where you have your pacemaker/defibrillator.  Avoid carrying your cellular phone near your device. When traveling through airports, show security personnel your identification card to avoid being screened in the metal detectors.  Ask the security personnel to use the hand wand. Avoid arc welding equipment, MRI testing (magnetic resonance imaging), TENS units (transcutaneous nerve stimulators).  Call the office for questions about other devices. Avoid electrical appliances that are in poor condition or are not properly grounded. Microwave ovens are safe to be near or to operate.

## 2021-04-17 NOTE — Progress Notes (Signed)
Dr Curt Bears states pt ready for discharge.

## 2021-04-17 NOTE — Progress Notes (Signed)
Pt ambulated without difficulty or bleeding.   Discharged home with his brother who will drive and stay with pt x 24 hrs.

## 2021-04-17 NOTE — H&P (Signed)
Electrophysiology Office Note   Date:  04/17/2021   ID:  Terrence Beard, Terrence Beard [birth year], MRN [id number]  PCP:  Josetta Huddle, MD  Cardiologist:  Richardson Dopp Primary Electrophysiologist:  Niema Carrara Meredith Leeds, MD    No chief complaint on file.    History of Present Illness: Terrence Beard is a 80 y.o. male who presents today for electrophysiology evaluation.     He has a history of right bundle branch block.  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  He has periods of Mobitz 1 AV block as well as complete heart block.  He presented to [facility] 11/03/2018 with shortness of breath.  He was found to have pneumonia as well as atrial flutter.  He also has a history of pulmonary embolism, and is on Xarelto.  Today, denies symptoms of palpitations, chest pain, shortness of breath, orthopnea, PND, lower extremity edema, claudication, dizziness, presyncope, syncope, bleeding, or neurologic sequela. The patient is tolerating medications without difficulties. Plan for pacemaker implant.    Past Medical History:  Diagnosis Date   Blood clot associated with vein wall inflammation    R. calf    CKD (chronic kidney disease) stage 3, GFR 30-59 ml/min (HCC)    not treated   DJD (degenerative joint disease)    Dysphagia    Erectile dysfunction    GERD (gastroesophageal reflux disease)    Glucose intolerance (impaired glucose tolerance)    History of echocardiogram    a. Echo 7/14:  EF 60-65%, mod asymmetric LVH, Mild LAE, mild to mod MR,    History of nuclear stress test    a. ETT-Myoview 7/14:  EF 71%, normal perfusion, low risk   Hypothyroidism    Multiple lipomas    Nasal polyposis    Obesity    RBBB    Seasonal allergies    Past Surgical History:  Procedure Laterality Date   COLONOSCOPY WITH PROPOFOL N/A 02/05/2016   Procedure: COLONOSCOPY WITH PROPOFOL;  Surgeon: Garlan Fair, MD;  Location: [facility];  Service: Endoscopy;  Laterality: N/A;    LYMPH NODE BIOPSY     TONSILLECTOMY     TOOTH EXTRACTION  10/07/2016     Current Facility-Administered Medications  Medication Dose Route Frequency Provider Last Rate Last Admin   0.9 %  sodium chloride infusion   Intravenous Continuous Constance Haw, MD 50 mL/hr at 04/17/21 1328 New Bag at 04/17/21 1328   ceFAZolin (ANCEF) IVPB 2g/100 mL premix  2 g Intravenous On Call Yudit Modesitt, Ocie Doyne, MD       gentamicin (GARAMYCIN) 80 mg in sodium chloride 0.9 % 500 mL irrigation  80 mg Irrigation On Call Galilee Pierron, Ocie Doyne, MD        Allergies:   Latex   Social History:  The patient  reports that he has never smoked. He has never used smokeless tobacco. He reports current alcohol use. He reports that he does not use drugs.   Family History:  The patient's family history includes Diabetes in his maternal grandmother; GER disease in his father; Kidney disease in his mother.   ROS:  Please see the history of present illness.   Otherwise, review of systems is positive for none.   All other systems are reviewed and negative.   PHYSICAL EXAM: VS:  BP (!) 165/54   Pulse (!) 50   Temp 98.5 F (36.9 C) (Oral)   Ht 5\' 11"  (1.803 m)   Wt 102.1 kg  SpO2 98%   BMI 31.38 kg/m  , BMI Body mass index is 31.38 kg/m. GEN: Well nourished, well developed, in no acute distress  HEENT: normal  Neck: no JVD, carotid bruits, or masses Cardiac: RRR; no murmurs, rubs, or gallops,no edema  Respiratory:  clear to auscultation bilaterally, normal work of breathing GI: soft, nontender, nondistended, + BS MS: no deformity or atrophy  Skin: warm and dry Neuro:  Strength and sensation are intact Psych: euthymic mood, full affect  Recent Labs: 04/16/2021: BUN 20; Creatinine, Ser 1.49; Hemoglobin 14.9; Platelets 215; Potassium 4.6; Sodium 138    Lipid Panel  No results found for: CHOL, TRIG, HDL, CHOLHDL, VLDL, LDLCALC, LDLDIRECT   Wt Readings from Last 3 Encounters:  04/17/21 102.1 kg   03/05/21 110.7 kg  11/18/19 108.9 kg      Other studies Reviewed: Additional studies/ records that were reviewed today include: 48 hour monitor  Review of the above records today demonstrates:  Mean heart rate 67 bpm Maximum heart rate 109 bpm Minimum heart rate 48 bpm Ventricular beats 497 (0.32%) Supraventricular beats 542 (0.3%) Sinus rhythm with first degree AV block Intermittent Mobitz I block      TTE 11/05/17 - Left ventricle: The cavity size was normal. There was moderate   focal basal hypertrophy of the septum. Systolic function was   vigorous. The estimated ejection fraction was in the range of 65%   to 70%. Wall motion was normal; there were no regional wall   motion abnormalities. The study is not technically sufficient to   allow evaluation of LV diastolic function. - Aortic valve: Trileaflet; mildly thickened, mildly calcified   leaflets. - Mitral valve: Calcified annulus. - Right atrium: The atrium was mildly dilated. - Pericardium, extracardiac: A trivial pericardial effusion was   identified.   ASSESSMENT AND PLAN:  1.  Complete heart block: Terrence Beard has presented today for surgery, with the diagnosis of heart block.  The various methods of treatment have been discussed with the patient and family. After consideration of risks, benefits and other options for treatment, the patient has consented to  Procedure(s): Pacemaker implant as a surgical intervention .  Risks include but not limited to bleeding, infection, pneumothorax, perforation, tamponade, vascular damage, renal failure, MI, stroke, death, and lead dislodgement . The patient's history has been reviewed, patient examined, no change in status, stable for surgery.  I have reviewed the patient's chart and labs.  Questions were answered to the patient's satisfaction.    Terrence Perfecto Curt Bears, MD 04/17/2021 2:07 PM

## 2021-04-18 ENCOUNTER — Encounter (HOSPITAL_COMMUNITY): Payer: Self-pay | Admitting: Cardiology

## 2021-04-18 ENCOUNTER — Telehealth: Payer: Self-pay

## 2021-04-18 MED FILL — Midazolam HCl Inj 5 MG/5ML (Base Equivalent): INTRAMUSCULAR | Qty: 5 | Status: AC

## 2021-04-18 MED FILL — Fentanyl Citrate Preservative Free (PF) Inj 100 MCG/2ML: INTRAMUSCULAR | Qty: 2 | Status: AC

## 2021-04-18 NOTE — Telephone Encounter (Signed)
The patient states at 4 am his pulse was at 90 bpm. 30 minutes later his pulse went down to 68 bpm. I got a transmission from the patient. Transmission received. I let him speak with Janett Billow, rn.

## 2021-04-18 NOTE — Telephone Encounter (Signed)
Pt aware Dr. Curt Bears will review.  Most likely going to give some more healing time to see if improvement. His concern is HRs "jumping up with only taking 20 steps, and that seems like a quick elevation of my pulse". He awoke this morning to HRs in the 90s.  Aware Dr. Curt Bears will review and forward advisement to device clinic who will follow up with pt and let him know what MD said(I am not in the office tomorrow)  Patient verbalized understanding and agreeable to plan.

## 2021-04-18 NOTE — Telephone Encounter (Signed)
Patient calling today about pacemaker concerns.Patient implanted with [employer] pacemaker on 04/19/21. Patient sent transmission from his monitor. Patient states that he thinks his blood pressure may be high because it was yesterday after the procedure. Patient complains with a headache today. Device showing normal device function. Patient is concerned that his pulse rate is increasing from the 60 or 70 BPM range to the 90 to upper 90 BPM's range within him taking two steps to the bathroom. Patient states that he had tingling near his incision site last night but this has now subsided. Spoke with patient about how his parameters are set on his pacemaker. Patient very concerned about his increase in pulse rate from minimal activity even though pulse has been within a normal range. He is concerned about exercise and his pulse rate. Patient is concerned that he may need adjustments or that something may be wrong with his pacemaker.   Sending to Dr. Curt Bears for any recommendations.

## 2021-04-19 NOTE — Telephone Encounter (Signed)
Spoke with patient. Advised patient that Dr. Curt Bears saw no need for the patient to come into the office at this point unless patient becomes symptomatic. Advised patient that his wound check appointment was on 04/30/21 and that the Device clinic would interrogate his pacemaker at that appointment. Advised patient to keep a check on his blood pressure and if any symptoms/events happen over the weekend, seek treatment at the ED. I also advised patient to contact the [facility] if any issues at [phone] and that he could be worked into the [facility] schedule for an appointment earlier than his scheduled if any problems.  Same day Discharge  Patient removed outer dressing:04/18/21. No signs and symptoms of infection or swelling Sling removed on 04/18/21   Patient has discharge instructions: Follow up appointment confirmed 04/30/21.

## 2021-04-30 ENCOUNTER — Other Ambulatory Visit: Payer: Self-pay

## 2021-04-30 ENCOUNTER — Ambulatory Visit (INDEPENDENT_AMBULATORY_CARE_PROVIDER_SITE_OTHER): Payer: Medicare Other

## 2021-04-30 DIAGNOSIS — I442 Atrioventricular block, complete: Secondary | ICD-10-CM

## 2021-04-30 LAB — CUP PACEART INCLINIC DEVICE CHECK
Battery Remaining Longevity: 126 mo
Battery Voltage: 3.14 V
Brady Statistic RA Percent Paced: 5.7 %
Brady Statistic RV Percent Paced: 99.18 %
Date Time Interrogation Session: 20220628133111
Implantable Lead Implant Date: 20220615
Implantable Lead Implant Date: 20220615
Implantable Lead Location: 753859
Implantable Lead Location: 753860
Implantable Pulse Generator Implant Date: 20220615
Lead Channel Impedance Value: 475 Ohm
Lead Channel Impedance Value: 512.5 Ohm
Lead Channel Pacing Threshold Amplitude: 0.625 V
Lead Channel Pacing Threshold Amplitude: 0.75 V
Lead Channel Pacing Threshold Pulse Width: 0.5 ms
Lead Channel Pacing Threshold Pulse Width: 0.5 ms
Lead Channel Sensing Intrinsic Amplitude: 12 mV
Lead Channel Sensing Intrinsic Amplitude: 4.8 mV
Lead Channel Setting Pacing Amplitude: 0.875
Lead Channel Setting Pacing Amplitude: 3.5 V
Lead Channel Setting Pacing Pulse Width: 0.5 ms
Lead Channel Setting Sensing Sensitivity: 4 mV
Pulse Gen Model: 2272
Pulse Gen Serial Number: 3933432

## 2021-04-30 NOTE — Progress Notes (Signed)

## 2021-05-28 ENCOUNTER — Telehealth: Payer: Self-pay | Admitting: Cardiology

## 2021-05-28 NOTE — Telephone Encounter (Signed)
New Message:family would li   Patient passed away on 05/30/2021. The family would like to know if they can bring device back to the office, They want to know if you can see on this device when the patient actually stopped breathing and  if you c an tell what caused it?

## 2021-05-28 NOTE — Telephone Encounter (Signed)
Attempted to return phone call to Gerald Stabs, # on record belongs to [facility].  Other son, Gwynneth Macleod also listed on DPR attempted to call him.  No answer, left detailed VM advising that they can return the remote monitor to our office for return/ recycling with [employer].  Last remote comm was on 05/12/21, however a report was not received on this date which indicates that device was working appropriately at that time.  Last full remote transmission was 04/18/21.  Per pt son call, he passed away on 2023/06/12.  Therefore we are not able to tell exactly when pt passed away.

## 2021-06-03 DEATH — deceased

## 2021-07-23 ENCOUNTER — Encounter: Payer: Medicare Other | Admitting: Cardiology
